# Patient Record
Sex: Male | Born: 1940
Health system: Southern US, Community
[De-identification: ages and names within clinical notes are randomized; demographics above are authoritative.]

## PROBLEM LIST (undated history)

## (undated) DIAGNOSIS — M1990 Unspecified osteoarthritis, unspecified site: Secondary | ICD-10-CM

## (undated) DIAGNOSIS — I429 Cardiomyopathy, unspecified: Secondary | ICD-10-CM

## (undated) DIAGNOSIS — I499 Cardiac arrhythmia, unspecified: Secondary | ICD-10-CM

## (undated) DIAGNOSIS — I119 Hypertensive heart disease without heart failure: Secondary | ICD-10-CM

## (undated) DIAGNOSIS — I10 Essential (primary) hypertension: Secondary | ICD-10-CM

## (undated) DIAGNOSIS — M2550 Pain in unspecified joint: Secondary | ICD-10-CM

## (undated) DIAGNOSIS — R002 Palpitations: Secondary | ICD-10-CM

## (undated) DIAGNOSIS — R9430 Abnormal result of cardiovascular function study, unspecified: Secondary | ICD-10-CM

## (undated) HISTORY — PX: APPENDECTOMY: SHX54

## (undated) HISTORY — DX: Pain in unspecified joint: M25.50

## (undated) HISTORY — DX: Abnormal result of cardiovascular function study, unspecified: R94.30

## (undated) HISTORY — DX: Palpitations: R00.2

## (undated) HISTORY — DX: Hypertensive heart disease without heart failure: I11.9

## (undated) HISTORY — DX: Unspecified osteoarthritis, unspecified site: M19.90

## (undated) HISTORY — DX: Cardiomyopathy, unspecified: I42.9

---

## 2008-05-07 ENCOUNTER — Emergency Department (HOSPITAL_COMMUNITY): Admission: EM | Admit: 2008-05-07 | Discharge: 2008-05-07 | Payer: Self-pay | Admitting: Emergency Medicine

## 2011-05-17 ENCOUNTER — Emergency Department (HOSPITAL_COMMUNITY): Payer: BC Managed Care – PPO

## 2011-05-17 ENCOUNTER — Emergency Department (HOSPITAL_COMMUNITY)
Admission: EM | Admit: 2011-05-17 | Discharge: 2011-05-17 | Disposition: A | Discharge status: Home / Self Care | Payer: BC Managed Care – PPO | Attending: Emergency Medicine | Admitting: Emergency Medicine

## 2011-05-17 DIAGNOSIS — S139XXA Sprain of joints and ligaments of unspecified parts of neck, initial encounter: Secondary | ICD-10-CM | POA: Insufficient documentation

## 2011-05-17 DIAGNOSIS — T1490XA Injury, unspecified, initial encounter: Secondary | ICD-10-CM | POA: Insufficient documentation

## 2011-05-17 DIAGNOSIS — R51 Headache: Secondary | ICD-10-CM | POA: Insufficient documentation

## 2011-05-17 DIAGNOSIS — R002 Palpitations: Secondary | ICD-10-CM | POA: Insufficient documentation

## 2011-05-17 DIAGNOSIS — M542 Cervicalgia: Secondary | ICD-10-CM | POA: Insufficient documentation

## 2011-05-17 DIAGNOSIS — Y92410 Unspecified street and highway as the place of occurrence of the external cause: Secondary | ICD-10-CM | POA: Insufficient documentation

## 2012-04-11 DIAGNOSIS — M545 Low back pain: Secondary | ICD-10-CM | POA: Diagnosis not present

## 2012-04-11 DIAGNOSIS — M542 Cervicalgia: Secondary | ICD-10-CM | POA: Diagnosis not present

## 2012-04-11 DIAGNOSIS — M1710 Unilateral primary osteoarthritis, unspecified knee: Secondary | ICD-10-CM | POA: Diagnosis not present

## 2012-04-11 DIAGNOSIS — M25569 Pain in unspecified knee: Secondary | ICD-10-CM | POA: Diagnosis not present

## 2012-04-17 DIAGNOSIS — H4010X0 Unspecified open-angle glaucoma, stage unspecified: Secondary | ICD-10-CM | POA: Diagnosis not present

## 2012-04-17 DIAGNOSIS — H2510 Age-related nuclear cataract, unspecified eye: Secondary | ICD-10-CM | POA: Diagnosis not present

## 2012-04-17 DIAGNOSIS — H409 Unspecified glaucoma: Secondary | ICD-10-CM | POA: Diagnosis not present

## 2012-05-21 DIAGNOSIS — M545 Low back pain, unspecified: Secondary | ICD-10-CM | POA: Diagnosis not present

## 2012-05-23 DIAGNOSIS — M545 Low back pain: Secondary | ICD-10-CM | POA: Diagnosis not present

## 2012-05-30 DIAGNOSIS — M542 Cervicalgia: Secondary | ICD-10-CM | POA: Diagnosis not present

## 2012-05-30 DIAGNOSIS — M545 Low back pain: Secondary | ICD-10-CM | POA: Diagnosis not present

## 2012-05-30 DIAGNOSIS — M2550 Pain in unspecified joint: Secondary | ICD-10-CM | POA: Diagnosis not present

## 2012-05-30 DIAGNOSIS — M1990 Unspecified osteoarthritis, unspecified site: Secondary | ICD-10-CM | POA: Diagnosis not present

## 2012-06-05 DIAGNOSIS — M47817 Spondylosis without myelopathy or radiculopathy, lumbosacral region: Secondary | ICD-10-CM | POA: Diagnosis not present

## 2012-06-20 DIAGNOSIS — I10 Essential (primary) hypertension: Secondary | ICD-10-CM | POA: Insufficient documentation

## 2012-06-20 DIAGNOSIS — Z888 Allergy status to other drugs, medicaments and biological substances status: Secondary | ICD-10-CM | POA: Insufficient documentation

## 2012-06-20 DIAGNOSIS — R112 Nausea with vomiting, unspecified: Secondary | ICD-10-CM | POA: Insufficient documentation

## 2012-06-21 ENCOUNTER — Emergency Department (HOSPITAL_COMMUNITY)
Admission: EM | Admit: 2012-06-21 | Discharge: 2012-06-21 | Disposition: A | Discharge status: Home / Self Care | Payer: BC Managed Care – PPO | Attending: Emergency Medicine | Admitting: Emergency Medicine

## 2012-06-21 ENCOUNTER — Encounter (HOSPITAL_COMMUNITY): Payer: Self-pay | Admitting: Family Medicine

## 2012-06-21 DIAGNOSIS — Z888 Allergy status to other drugs, medicaments and biological substances status: Secondary | ICD-10-CM | POA: Diagnosis not present

## 2012-06-21 DIAGNOSIS — R112 Nausea with vomiting, unspecified: Secondary | ICD-10-CM

## 2012-06-21 DIAGNOSIS — I10 Essential (primary) hypertension: Secondary | ICD-10-CM | POA: Diagnosis not present

## 2012-06-21 HISTORY — DX: Essential (primary) hypertension: I10

## 2012-06-21 HISTORY — DX: Cardiac arrhythmia, unspecified: I49.9

## 2012-06-21 LAB — CBC WITH DIFFERENTIAL/PLATELET
Basophils Relative: 0 % (ref 0–1)
Eosinophils Absolute: 0 10*3/uL (ref 0.0–0.7)
Lymphs Abs: 1.3 10*3/uL (ref 0.7–4.0)
MCH: 28.2 pg (ref 26.0–34.0)
MCHC: 33 g/dL (ref 30.0–36.0)
Neutrophils Relative %: 75 % (ref 43–77)
Platelets: 178 10*3/uL (ref 150–400)
RBC: 4.82 MIL/uL (ref 4.22–5.81)

## 2012-06-21 LAB — COMPREHENSIVE METABOLIC PANEL
ALT: 15 U/L (ref 0–53)
AST: 18 U/L (ref 0–37)
Albumin: 3.6 g/dL (ref 3.5–5.2)
Alkaline Phosphatase: 74 U/L (ref 39–117)
Potassium: 3.8 mEq/L (ref 3.5–5.1)
Sodium: 140 mEq/L (ref 135–145)
Total Protein: 7.8 g/dL (ref 6.0–8.3)

## 2012-06-21 NOTE — ED Notes (Signed)
Daughter states that patient started having dizziness, nausea and vomiting that started around 830pm. Had 4 episodes of vomiting.

## 2012-06-21 NOTE — ED Provider Notes (Signed)
History     CSN: [id number]  Arrival date & time 06/20/12  2311   First MD Initiated Contact with Patient 06/21/12 0251      Chief Complaint  Patient presents with  . Dizziness and vomiting     (Consider location/radiation/quality/duration/timing/severity/associated sxs/prior treatment) HPI This is a 71 year old African male who had the sudden onset of lightheadedness followed by nausea and vomiting about 8:30 PM yesterday. He vomited 4 times. By the time he got to the emergency department his symptoms had resolved. He had 2 bowel movements but these were of normal consistency. He denies abdominal pain. He denies chest pain. He denies dyspnea. There is no known exacerbating or mitigating factor except that his lightheadedness was worse with standing. He denies a sensation of the room spinning. He continues to be asymptomatic in the ED.  Past Medical History  Diagnosis Date  . Hypertension   . Irregular heart beat     Past Surgical History  Procedure Date  . Appendectomy     No family history on file.  History  Substance Use Topics  . Smoking status: Never Smoker   . Smokeless tobacco: Not on file  . Alcohol Use: No      Review of Systems  All other systems reviewed and are negative.  Left cataract, surgery scheduled next week. Bilateral lower extremity edema.  Allergies  Caine-1  Home Medications   Current Outpatient Rx  Name Route Sig Dispense Refill  . BESIFLOXACIN HCL 0.6 % OP SUSP Left Eye Place 1 drop into the left eye 3 (three) times daily. To start 06/22/12    . DIFLUPREDNATE 0.05 % OP EMUL Left Eye Place 1 drop into the left eye 3 (three) times daily. To start 06/22/12    . LISINOPRIL 20 MG PO TABS Oral Take 20 mg by mouth daily.    Marland Kitchen METOPROLOL SUCCINATE ER 50 MG PO TB24 Oral Take 50 mg by mouth daily. Take with or immediately following a meal.    . NEPAFENAC 0.1 % OP SUSP Left Eye Place 3 drops into the left eye 3 (three) times daily. To start 06/22/12       BP 171/81  Pulse 66  Temp 98.2 F (36.8 C) (Oral)  Resp 20  SpO2 100%  Physical Exam General: Well-developed, well-nourished male in no acute distress; appearance consistent with age of record HENT: normocephalic, atraumatic Eyes: pupils equal round and reactive to light; extraocular muscles intact; left cataract; arcus senilis bilaterally Neck: supple Heart: regular rate and rhythm Lungs: clear to auscultation bilaterally Abdomen: soft; nondistended; nontender; bowel sounds present Extremities: No deformity; full range of motion; +1 edema of lower legs Neurologic: Awake, alert; motor function intact in all extremities and symmetric; no facial droop Skin: Warm and dry Psychiatric: Normal mood and affect    ED Course  Procedures (including critical care time)     MDM   Nursing notes and vitals signs, including pulse oximetry, reviewed.  Summary of this visit's results, reviewed by myself:  Labs:  Results for orders placed during the hospital encounter of 06/21/12  CBC WITH DIFFERENTIAL      Component Value Range   WBC 7.5  4.0 - 10.5 K/uL   RBC 4.82  4.22 - 5.81 MIL/uL   Hemoglobin 13.6  13.0 - 17.0 g/dL   HCT 41.2  39.0 - 52.0 %   MCV 85.5  78.0 - 100.0 fL   MCH 28.2  26.0 - 34.0 pg   MCHC 33.0  30.0 - 36.0 g/dL   RDW 14.9  11.5 - 15.5 %   Platelets 178  150 - 400 K/uL   Neutrophils Relative 75  43 - 77 %   Neutro Abs 5.6  1.7 - 7.7 K/uL   Lymphocytes Relative 17  12 - 46 %   Lymphs Abs 1.3  0.7 - 4.0 K/uL   Monocytes Relative 8  3 - 12 %   Monocytes Absolute 0.6  0.1 - 1.0 K/uL   Eosinophils Relative 0  0 - 5 %   Eosinophils Absolute 0.0  0.0 - 0.7 K/uL   Basophils Relative 0  0 - 1 %   Basophils Absolute 0.0  0.0 - 0.1 K/uL  COMPREHENSIVE METABOLIC PANEL      Component Value Range   Sodium 140  135 - 145 mEq/L   Potassium 3.8  3.5 - 5.1 mEq/L   Chloride 102  96 - 112 mEq/L   CO2 31  19 - 32 mEq/L   Glucose, Bld 107 (*) 70 - 99 mg/dL   BUN 13  6  - 23 mg/dL   Creatinine, Ser 1.00  0.50 - 1.35 mg/dL   Calcium 9.5  8.4 - 10.5 mg/dL   Total Protein 7.8  6.0 - 8.3 g/dL   Albumin 3.6  3.5 - 5.2 g/dL   AST 18  0 - 37 U/L   ALT 15  0 - 53 U/L   Alkaline Phosphatase 74  39 - 117 U/L   Total Bilirubin 0.7  0.3 - 1.2 mg/dL   GFR calc non Af Amer 74 (*) >90 mL/min   GFR calc Af Amer 86 (*) >90 mL/min  LIPASE, BLOOD      Component Value Range   Lipase 23  11 - 59 U/L            Wynetta Fines, MD 06/21/12 [id number]

## 2012-06-25 DIAGNOSIS — H269 Unspecified cataract: Secondary | ICD-10-CM | POA: Diagnosis not present

## 2012-06-25 DIAGNOSIS — H2510 Age-related nuclear cataract, unspecified eye: Secondary | ICD-10-CM | POA: Diagnosis not present

## 2012-06-26 DIAGNOSIS — H2510 Age-related nuclear cataract, unspecified eye: Secondary | ICD-10-CM | POA: Diagnosis not present

## 2012-07-02 DIAGNOSIS — M545 Low back pain: Secondary | ICD-10-CM | POA: Diagnosis not present

## 2012-07-24 DIAGNOSIS — H5310 Unspecified subjective visual disturbances: Secondary | ICD-10-CM | POA: Diagnosis not present

## 2012-08-30 DIAGNOSIS — I428 Other cardiomyopathies: Secondary | ICD-10-CM | POA: Diagnosis not present

## 2012-08-30 DIAGNOSIS — R002 Palpitations: Secondary | ICD-10-CM | POA: Diagnosis not present

## 2012-09-11 DIAGNOSIS — Z79899 Other long term (current) drug therapy: Secondary | ICD-10-CM | POA: Diagnosis not present

## 2012-09-11 DIAGNOSIS — E7800 Pure hypercholesterolemia, unspecified: Secondary | ICD-10-CM | POA: Diagnosis not present

## 2013-06-18 DIAGNOSIS — H4010X0 Unspecified open-angle glaucoma, stage unspecified: Secondary | ICD-10-CM | POA: Diagnosis not present

## 2013-06-18 DIAGNOSIS — H409 Unspecified glaucoma: Secondary | ICD-10-CM | POA: Diagnosis not present

## 2013-06-25 DIAGNOSIS — H409 Unspecified glaucoma: Secondary | ICD-10-CM | POA: Diagnosis not present

## 2013-06-25 DIAGNOSIS — H4010X0 Unspecified open-angle glaucoma, stage unspecified: Secondary | ICD-10-CM | POA: Diagnosis not present

## 2013-07-30 ENCOUNTER — Telehealth: Payer: Self-pay | Admitting: Cardiology

## 2013-07-30 NOTE — Telephone Encounter (Signed)
New Problem   Pt's daughter called stating that he has discomfort in his chest//uneasy to swollow// requesting same day appt// please assist.

## 2013-08-04 ENCOUNTER — Encounter: Payer: Self-pay | Admitting: Cardiology

## 2013-08-04 ENCOUNTER — Encounter: Payer: Self-pay | Admitting: *Deleted

## 2013-08-05 ENCOUNTER — Ambulatory Visit: Payer: BC Managed Care – PPO | Admitting: Cardiology

## 2013-08-08 NOTE — Telephone Encounter (Signed)
This number is listed as the wrong number,,,,,

## 2013-08-12 DIAGNOSIS — I83893 Varicose veins of bilateral lower extremities with other complications: Secondary | ICD-10-CM | POA: Diagnosis not present

## 2013-08-12 DIAGNOSIS — T148XXA Other injury of unspecified body region, initial encounter: Secondary | ICD-10-CM | POA: Diagnosis not present

## 2013-08-12 DIAGNOSIS — Z23 Encounter for immunization: Secondary | ICD-10-CM | POA: Diagnosis not present

## 2013-11-11 DIAGNOSIS — H409 Unspecified glaucoma: Secondary | ICD-10-CM | POA: Diagnosis not present

## 2013-11-11 DIAGNOSIS — H4011X0 Primary open-angle glaucoma, stage unspecified: Secondary | ICD-10-CM | POA: Diagnosis not present

## 2014-01-08 DIAGNOSIS — H409 Unspecified glaucoma: Secondary | ICD-10-CM | POA: Diagnosis not present

## 2014-01-08 DIAGNOSIS — H4011X0 Primary open-angle glaucoma, stage unspecified: Secondary | ICD-10-CM | POA: Diagnosis not present

## 2014-01-27 DIAGNOSIS — H4011X0 Primary open-angle glaucoma, stage unspecified: Secondary | ICD-10-CM | POA: Diagnosis not present

## 2014-01-27 DIAGNOSIS — H409 Unspecified glaucoma: Secondary | ICD-10-CM | POA: Diagnosis not present

## 2014-07-02 DIAGNOSIS — H4011X0 Primary open-angle glaucoma, stage unspecified: Secondary | ICD-10-CM | POA: Diagnosis not present

## 2014-07-02 DIAGNOSIS — H409 Unspecified glaucoma: Secondary | ICD-10-CM | POA: Diagnosis not present

## 2014-12-09 ENCOUNTER — Encounter (HOSPITAL_COMMUNITY): Payer: Self-pay | Admitting: Emergency Medicine

## 2014-12-09 ENCOUNTER — Emergency Department (HOSPITAL_COMMUNITY)
Admission: EM | Admit: 2014-12-09 | Discharge: 2014-12-09 | Disposition: A | Discharge status: Home / Self Care | Payer: BLUE CROSS/BLUE SHIELD | Attending: Emergency Medicine | Admitting: Emergency Medicine

## 2014-12-09 DIAGNOSIS — Z792 Long term (current) use of antibiotics: Secondary | ICD-10-CM | POA: Insufficient documentation

## 2014-12-09 DIAGNOSIS — Z791 Long term (current) use of non-steroidal anti-inflammatories (NSAID): Secondary | ICD-10-CM | POA: Insufficient documentation

## 2014-12-09 DIAGNOSIS — R112 Nausea with vomiting, unspecified: Secondary | ICD-10-CM | POA: Diagnosis not present

## 2014-12-09 DIAGNOSIS — Z79899 Other long term (current) drug therapy: Secondary | ICD-10-CM | POA: Diagnosis not present

## 2014-12-09 DIAGNOSIS — I119 Hypertensive heart disease without heart failure: Secondary | ICD-10-CM | POA: Insufficient documentation

## 2014-12-09 DIAGNOSIS — I10 Essential (primary) hypertension: Secondary | ICD-10-CM | POA: Diagnosis not present

## 2014-12-09 DIAGNOSIS — Z8739 Personal history of other diseases of the musculoskeletal system and connective tissue: Secondary | ICD-10-CM | POA: Diagnosis not present

## 2014-12-09 LAB — COMPREHENSIVE METABOLIC PANEL
ALT: 27 U/L (ref 0–53)
AST: 36 U/L (ref 0–37)
Albumin: 3.9 g/dL (ref 3.5–5.2)
Alkaline Phosphatase: 83 U/L (ref 39–117)
Anion gap: 4 — ABNORMAL LOW (ref 5–15)
BILIRUBIN TOTAL: 0.6 mg/dL (ref 0.3–1.2)
BUN: 20 mg/dL (ref 6–23)
CHLORIDE: 108 mmol/L (ref 96–112)
CO2: 24 mmol/L (ref 19–32)
Calcium: 8.6 mg/dL (ref 8.4–10.5)
Creatinine, Ser: 1.21 mg/dL (ref 0.50–1.35)
GFR calc Af Amer: 67 mL/min — ABNORMAL LOW (ref >90)
GFR, EST NON AFRICAN AMERICAN: 58 mL/min — AB (ref >90)
GLUCOSE: 171 mg/dL — AB (ref 70–99)
POTASSIUM: 4 mmol/L (ref 3.5–5.1)
SODIUM: 136 mmol/L (ref 135–145)
Total Protein: 7.8 g/dL (ref 6.0–8.3)

## 2014-12-09 LAB — I-STAT CHEM 8, ED
BUN: 19 mg/dL (ref 6–23)
CREATININE: 1.2 mg/dL (ref 0.50–1.35)
Calcium, Ion: 1.23 mmol/L (ref 1.13–1.30)
Chloride: 106 mmol/L (ref 96–112)
GLUCOSE: 168 mg/dL — AB (ref 70–99)
HCT: 45 % (ref 39.0–52.0)
Hemoglobin: 15.3 g/dL (ref 13.0–17.0)
POTASSIUM: 3.8 mmol/L (ref 3.5–5.1)
SODIUM: 142 mmol/L (ref 135–145)
TCO2: 20 mmol/L (ref 0–100)

## 2014-12-09 LAB — URINALYSIS, ROUTINE W REFLEX MICROSCOPIC
BILIRUBIN URINE: NEGATIVE
GLUCOSE, UA: NEGATIVE mg/dL
HGB URINE DIPSTICK: NEGATIVE
Ketones, ur: NEGATIVE mg/dL
LEUKOCYTES UA: NEGATIVE
Nitrite: NEGATIVE
PH: 7 (ref 5.0–8.0)
PROTEIN: NEGATIVE mg/dL
SPECIFIC GRAVITY, URINE: 1.01 (ref 1.005–1.030)
UROBILINOGEN UA: 0.2 mg/dL (ref 0.0–1.0)

## 2014-12-09 LAB — CBC WITH DIFFERENTIAL/PLATELET
Basophils Absolute: 0 10*3/uL (ref 0.0–0.1)
Basophils Relative: 0 % (ref 0–1)
EOS ABS: 0 10*3/uL (ref 0.0–0.7)
Eosinophils Relative: 0 % (ref 0–5)
HCT: 41.1 % (ref 39.0–52.0)
HEMOGLOBIN: 13 g/dL (ref 13.0–17.0)
LYMPHS PCT: 36 % (ref 12–46)
Lymphs Abs: 3 10*3/uL (ref 0.7–4.0)
MCH: 27.9 pg (ref 26.0–34.0)
MCHC: 31.6 g/dL (ref 30.0–36.0)
MCV: 88.2 fL (ref 78.0–100.0)
Monocytes Absolute: 0.4 10*3/uL (ref 0.1–1.0)
Monocytes Relative: 5 % (ref 3–12)
NEUTROS ABS: 5 10*3/uL (ref 1.7–7.7)
NEUTROS PCT: 59 % (ref 43–77)
Platelets: 151 10*3/uL (ref 150–400)
RBC: 4.66 MIL/uL (ref 4.22–5.81)
RDW: 14.7 % (ref 11.5–15.5)
WBC: 8.5 10*3/uL (ref 4.0–10.5)

## 2014-12-09 LAB — I-STAT CG4 LACTIC ACID, ED: Lactic Acid, Venous: 1.85 mmol/L (ref 0.5–2.0)

## 2014-12-09 LAB — I-STAT TROPONIN, ED: Troponin i, poc: 0 ng/mL (ref 0.00–0.08)

## 2014-12-09 LAB — LIPASE, BLOOD: LIPASE: 26 U/L (ref 11–59)

## 2014-12-09 MED ORDER — ONDANSETRON 4 MG PO TBDP: ORAL_TABLET | ORAL | Status: DC

## 2014-12-09 MED ORDER — SODIUM CHLORIDE 0.9 % IV BOLUS (SEPSIS)
1000.0000 mL | Freq: Once | INTRAVENOUS | Status: AC
  Administered 2014-12-09: 1000 mL via INTRAVENOUS

## 2014-12-09 MED ORDER — ONDANSETRON HCL 4 MG/2ML IJ SOLN
4.0000 mg | Freq: Once | INTRAMUSCULAR | Status: AC
  Administered 2014-12-09: 4 mg via INTRAVENOUS
  Filled 2014-12-09: qty 2

## 2014-12-09 NOTE — ED Notes (Signed)
Pt ambulated to restroom and back to bed with assistance. Pt reports having a bowel movement but not able to obtain a urine sample. Provided patient ginger ale.

## 2014-12-09 NOTE — ED Provider Notes (Signed)
CSN: [id number]     Arrival date & time 12/09/14  0454 History   First MD Initiated Contact with Patient 12/09/14 0617     Chief Complaint  Patient presents with  . Emesis     (Consider location/radiation/quality/duration/timing/severity/associated sxs/prior Treatment) HPI Shane Vargas is a 74 y.o. male with a history of hypertension comes in for evaluation of nausea and vomiting. Patient states this morning at approximately 4 AM he became nauseous and vomited, nonbloody nonbilious. He reports he had a banana for breakfast. He denies any other associated symptoms, no chest pain, shortness of breath, cough, abdominal pain, diarrhea or constipation, dizziness, syncope. He reports his symptoms have resolved at this point. He did not try anything to relieve his symptoms. There are no other modifying factors.   Past Medical History  Diagnosis Date  . Hypertension   . Irregular heart beat     Echocardiogram -[id number], diastolic dysfunction, trivial valvular lesions  . Cardiomyopathy     Nuclear stress test- 02/16/09 - 7 minutes 6 seconds, dilated LV, subtle decreased uptake apical region, no ischemia, EF 42%   . Palpitation     Holter-01/16/09-frequent PVCs/couplets, one triplet (NSVT). 8 PACs. Average heart rate 82 beats per minute  . Osteoarthritis   . Pain in joint, multiple sites   . Nonspecific abnormal unspecified cardiovascular function study   . Benign hypertensive heart disease without heart failure    Past Surgical History  Procedure Laterality Date  . Appendectomy     History reviewed. No pertinent family history. History  Substance Use Topics  . Smoking status: Never Smoker   . Smokeless tobacco: Not on file  . Alcohol Use: No    Review of Systems A 10 point review of systems was completed and was negative except for pertinent positives and negatives as mentioned in the history of present illness     Allergies  Caine-1  Home Medications   Prior to Admission  medications   Medication Sig Start Date End Date Taking? Authorizing Provider  lisinopril (PRINIVIL,ZESTRIL) 20 MG tablet Take 20 mg by mouth daily.   Yes Historical Provider, MD  metoprolol succinate (TOPROL-XL) 50 MG 24 hr tablet Take 50 mg by mouth daily. Take with or immediately following a meal.   Yes Historical Provider, MD  Besifloxacin HCl (BESIVANCE) 0.6 % SUSP Place 1 drop into the left eye 3 (three) times daily. To start 06/22/12    Historical Provider, MD  Difluprednate (DUREZOL) 0.05 % EMUL Place 1 drop into the left eye 3 (three) times daily. To start 06/22/12    Historical Provider, MD  nepafenac (NEVANAC) 0.1 % ophthalmic suspension Place 3 drops into the left eye 3 (three) times daily. To start 06/22/12    Historical Provider, MD  ondansetron (ZOFRAN ODT) 4 MG disintegrating tablet 4mg  ODT q4 hours prn nausea/vomit 12/09/14   Viona Gilmore Jasyn Mey, PA-C   BP 185/88 mmHg  Pulse 71  Temp(Src) 97.5 F (36.4 C) (Oral)  Resp 20  SpO2 99% Physical Exam  Constitutional: He is oriented to person, place, and time. He appears well-developed and well-nourished.  HENT:  Head: Normocephalic and atraumatic.  Mouth/Throat: Oropharynx is clear and moist.  Eyes: Conjunctivae are normal. Pupils are equal, round, and reactive to light. Right eye exhibits no discharge. Left eye exhibits no discharge. No scleral icterus.  Neck: Normal range of motion. Neck supple.  Cardiovascular: Normal rate, regular rhythm and normal heart sounds.   Pulmonary/Chest: Effort normal and breath sounds normal. No  respiratory distress. He has no wheezes. He has no rales.  Abdominal: Soft. Bowel sounds are normal. He exhibits no distension and no mass. There is no tenderness. There is no rebound and no guarding.  Musculoskeletal: He exhibits no tenderness.  Neurological: He is alert and oriented to person, place, and time.  Cranial Nerves II-XII grossly intact  Skin: Skin is warm and dry. No rash noted.  Psychiatric: He  has a normal mood and affect.  Nursing note and vitals reviewed.   ED Course  Procedures (including critical care time) Labs Review Labs Reviewed  COMPREHENSIVE METABOLIC PANEL - Abnormal; Notable for the following:    Glucose, Bld 171 (*)    GFR calc non Af Amer 58 (*)    GFR calc Af Amer 67 (*)    Anion gap 4 (*)    All other components within normal limits  I-STAT CHEM 8, ED - Abnormal; Notable for the following:    Glucose, Bld 168 (*)    All other components within normal limits  CBC WITH DIFFERENTIAL/PLATELET  LIPASE, BLOOD  URINALYSIS, ROUTINE W REFLEX MICROSCOPIC  I-STAT CG4 LACTIC ACID, ED  I-STAT TROPOININ, ED    Imaging Review No results found.   EKG Interpretation   Date/Time:  Tuesday December 09 2014 05:07:49 EST Ventricular Rate:  70 PR Interval:  226 QRS Duration: 102 QT Interval:  433 QTC Calculation: 467 R Axis:   27 Text Interpretation:  Sinus rhythm Ventricular premature complex Prolonged  PR interval Confirmed by Glynn Octave [phone]) on 12/09/2014 6:29:46  AM     Meds given in ED:  Medications  ondansetron (ZOFRAN) injection 4 mg (4 mg Intravenous Given 12/09/14 0532)  sodium chloride 0.9 % bolus 1,000 mL (0 mLs Intravenous Stopped 12/09/14 0910)    Discharge Medication List as of 12/09/2014  8:38 AM    START taking these medications   Details  ondansetron (ZOFRAN ODT) 4 MG disintegrating tablet 4mg  ODT q4 hours prn nausea/vomit, Print       Filed Vitals:   12/09/14 0456 12/09/14 0647 12/09/14 0908  BP: 190/82 185/79 185/88  Pulse: 60 55 71  Temp: 97.5 F (36.4 C) 97.3 F (36.3 C) 97.5 F (36.4 C)  TempSrc: Oral Oral Oral  Resp: 20 19 20   SpO2: 100% 100% 99%    MDM  Vitals stable  -afebrile,  Pt resting comfortably in ED. Tolerating PO IN ED  PE--benign abdominal exam, Ambulates to and from the restroom without difficulty.  Labwork Noncontributory . EKG is reassuring, prolonged PR interval is chronic. Troponin  negative  Patient feels well at this time, denies any symptoms. No nausea or vomiting, tolerating fluids in ED. Ambulate independently, labs are reassuring. I feel patient is stable for discharge with Zofran and instructions for clear fluid rehydration. Discussed we'll need to follow-up with his primary care for blood pressure management.  I discussed all relevant lab findings and imaging results with pt and they verbalized understanding. Discussed f/u with PCP within 48 hrs and return precautions, pt very amenable to plan. Prior to patient discharge, I discussed and reviewed this case with Dr. Tawnya Crook, who also saw and evaluated the patient     Final diagnoses:  Non-intractable vomiting with nausea, vomiting of unspecified type        Verl Dicker, PA-C 12/09/14 1734  Ernestina Patches, MD 12/10/14 [phone]

## 2014-12-09 NOTE — ED Notes (Signed)
Pt resting quietly with eyes closed. Appears in acute distress.

## 2014-12-09 NOTE — ED Notes (Signed)
Family states pt was at work and they called her and told her he was throwing up  Pt is c/o general weakness and feeling tired  Denies pain

## 2014-12-09 NOTE — Discharge Instructions (Signed)

## 2016-08-31 DIAGNOSIS — H401133 Primary open-angle glaucoma, bilateral, severe stage: Secondary | ICD-10-CM | POA: Diagnosis not present

## 2016-12-09 DIAGNOSIS — H401133 Primary open-angle glaucoma, bilateral, severe stage: Secondary | ICD-10-CM | POA: Diagnosis not present

## 2017-01-04 DIAGNOSIS — H401133 Primary open-angle glaucoma, bilateral, severe stage: Secondary | ICD-10-CM | POA: Diagnosis not present

## 2017-02-02 DIAGNOSIS — Z86010 Personal history of colonic polyps: Secondary | ICD-10-CM | POA: Diagnosis not present

## 2017-02-02 DIAGNOSIS — D126 Benign neoplasm of colon, unspecified: Secondary | ICD-10-CM | POA: Diagnosis not present

## 2017-02-16 DIAGNOSIS — E7800 Pure hypercholesterolemia, unspecified: Secondary | ICD-10-CM | POA: Diagnosis not present

## 2017-02-16 DIAGNOSIS — I429 Cardiomyopathy, unspecified: Secondary | ICD-10-CM | POA: Diagnosis not present

## 2017-02-16 DIAGNOSIS — I10 Essential (primary) hypertension: Secondary | ICD-10-CM | POA: Diagnosis not present

## 2017-02-16 DIAGNOSIS — Z1389 Encounter for screening for other disorder: Secondary | ICD-10-CM | POA: Diagnosis not present

## 2017-02-16 DIAGNOSIS — Z125 Encounter for screening for malignant neoplasm of prostate: Secondary | ICD-10-CM | POA: Diagnosis not present

## 2017-03-15 DIAGNOSIS — E860 Dehydration: Secondary | ICD-10-CM | POA: Diagnosis not present

## 2017-03-15 DIAGNOSIS — D649 Anemia, unspecified: Secondary | ICD-10-CM | POA: Diagnosis not present

## 2017-03-16 ENCOUNTER — Encounter: Payer: Self-pay | Admitting: Cardiology

## 2017-03-23 ENCOUNTER — Encounter: Payer: Self-pay | Admitting: Cardiology

## 2017-04-07 ENCOUNTER — Ambulatory Visit: Payer: Medicare Other | Admitting: Cardiology

## 2017-04-24 ENCOUNTER — Encounter: Payer: Self-pay | Admitting: Physician Assistant

## 2017-04-24 ENCOUNTER — Ambulatory Visit (INDEPENDENT_AMBULATORY_CARE_PROVIDER_SITE_OTHER): Payer: BLUE CROSS/BLUE SHIELD | Admitting: Physician Assistant

## 2017-04-24 ENCOUNTER — Encounter (INDEPENDENT_AMBULATORY_CARE_PROVIDER_SITE_OTHER): Payer: Self-pay

## 2017-04-24 VITALS — BP 118/70 | HR 57 | Ht 68.0 in | Wt 225.0 lb

## 2017-04-24 DIAGNOSIS — R002 Palpitations: Secondary | ICD-10-CM | POA: Insufficient documentation

## 2017-04-24 DIAGNOSIS — E785 Hyperlipidemia, unspecified: Secondary | ICD-10-CM

## 2017-04-24 DIAGNOSIS — R079 Chest pain, unspecified: Secondary | ICD-10-CM

## 2017-04-24 DIAGNOSIS — N183 Chronic kidney disease, stage 3 unspecified: Secondary | ICD-10-CM | POA: Insufficient documentation

## 2017-04-24 DIAGNOSIS — I5189 Other ill-defined heart diseases: Secondary | ICD-10-CM

## 2017-04-24 DIAGNOSIS — I519 Heart disease, unspecified: Secondary | ICD-10-CM

## 2017-04-24 DIAGNOSIS — I10 Essential (primary) hypertension: Secondary | ICD-10-CM | POA: Diagnosis not present

## 2017-04-24 NOTE — Patient Instructions (Addendum)
Medication Instructions:  Your physician recommends that you continue on your current medications as directed. Please refer to the Current Medication list given to you today.   Labwork: NONE ORDERED TODAY   Testing/Procedures: 1. Your physician has requested that you have en exercise stress myoview. For further information please visit [email or web address]. Please follow instruction sheet, as given.  2. Your physician has recommended that you wear a 48 HOUR holter monitor. Holter monitors are medical devices that record the heart's electrical activity. Doctors most often use these monitors to diagnose arrhythmias. Arrhythmias are problems with the speed or rhythm of the heartbeat. The monitor is a small, portable device. You can wear one while you do your normal daily activities. This is usually used to diagnose what is causing palpitations/syncope (passing out).    Follow-Up: DR. Marlou Porch 1 WEEK AFTER STRESS TEST HAS BEEN COMPLETED  Any Other Special Instructions Will Be Listed Below (If Applicable). If you need a refill on your cardiac medications before your next appointment, please call your pharmacy.   Low-Sodium Eating Plan Sodium, which is an element that makes up salt, helps you maintain a healthy balance of fluids in your body. Too much sodium can increase your blood pressure and cause fluid and waste to be held in your body. Your health care provider or dietitian may recommend following this plan if you have high blood pressure (hypertension), kidney disease, liver disease, or heart failure. Eating less sodium can help lower your blood pressure, reduce swelling, and protect your heart, liver, and kidneys. What are tips for following this plan? General guidelines  Most people on this plan should limit their sodium intake to 1,500-2,000 mg (milligrams) of sodium each day. Reading food labels  The Nutrition Facts label lists the amount of sodium in one serving of the food. If you  eat more than one serving, you must multiply the listed amount of sodium by the number of servings.  Choose foods with less than 140 mg of sodium per serving.  Avoid foods with 300 mg of sodium or more per serving. Shopping  Look for lower-sodium products, often labeled as "low-sodium" or "no salt added."  Always check the sodium content even if foods are labeled as "unsalted" or "no salt added".  Buy fresh foods. ? Avoid canned foods and premade or frozen meals. ? Avoid canned, cured, or processed meats  Buy breads that have less than 80 mg of sodium per slice. Cooking  Eat more home-cooked food and less restaurant, buffet, and fast food.  Avoid adding salt when cooking. Use salt-free seasonings or herbs instead of table salt or sea salt. Check with your health care provider or pharmacist before using salt substitutes.  Cook with plant-based oils, such as canola, sunflower, or olive oil. Meal planning  When eating at a restaurant, ask that your food be prepared with less salt or no salt, if possible.  Avoid foods that contain MSG (monosodium glutamate). MSG is sometimes added to [place] food, bouillon, and some canned foods. What foods are recommended? The items listed may not be a complete list. Talk with your dietitian about what dietary choices are best for you. Grains Low-sodium cereals, including oats, puffed wheat and rice, and shredded wheat. Low-sodium crackers. Unsalted rice. Unsalted pasta. Low-sodium bread. Whole-grain breads and whole-grain pasta. Vegetables Fresh or frozen vegetables. "No salt added" canned vegetables. "No salt added" tomato sauce and paste. Low-sodium or reduced-sodium tomato and vegetable juice. Fruits Fresh, frozen, or canned fruit. Fruit juice. Meats and  other protein foods Fresh or frozen (no salt added) meat, poultry, seafood, and fish. Low-sodium canned tuna and salmon. Unsalted nuts. Dried peas, beans, and lentils without added salt.  Unsalted canned beans. Eggs. Unsalted nut butters. Dairy Milk. Soy milk. Cheese that is naturally low in sodium, such as ricotta cheese, fresh mozzarella, or Swiss cheese Low-sodium or reduced-sodium cheese. Cream cheese. Yogurt. Fats and oils Unsalted butter. Unsalted margarine with no trans fat. Vegetable oils such as canola or olive oils. Seasonings and other foods Fresh and dried herbs and spices. Salt-free seasonings. Low-sodium mustard and ketchup. Sodium-free salad dressing. Sodium-free light mayonnaise. Fresh or refrigerated horseradish. Lemon juice. Vinegar. Homemade, reduced-sodium, or low-sodium soups. Unsalted popcorn and pretzels. Low-salt or salt-free chips. What foods are not recommended? The items listed may not be a complete list. Talk with your dietitian about what dietary choices are best for you. Grains Instant hot cereals. Bread stuffing, pancake, and biscuit mixes. Croutons. Seasoned rice or pasta mixes. Noodle soup cups. Boxed or frozen macaroni and cheese. Regular salted crackers. Self-rising flour. Vegetables Sauerkraut, pickled vegetables, and relishes. Olives. [place] fries. Onion rings. Regular canned vegetables (not low-sodium or reduced-sodium). Regular canned tomato sauce and paste (not low-sodium or reduced-sodium). Regular tomato and vegetable juice (not low-sodium or reduced-sodium). Frozen vegetables in sauces. Meats and other protein foods Meat or fish that is salted, canned, smoked, spiced, or pickled. Bacon, ham, sausage, hotdogs, corned beef, chipped beef, packaged lunch meats, salt pork, jerky, pickled herring, anchovies, regular canned tuna, sardines, salted nuts. Dairy Processed cheese and cheese spreads. Cheese curds. Blue cheese. Feta cheese. String cheese. Regular cottage cheese. Buttermilk. Canned milk. Fats and oils Salted butter. Regular margarine. Ghee. Bacon fat. Seasonings and other foods Onion salt, garlic salt, seasoned salt, table salt, and sea  salt. Canned and packaged gravies. Worcestershire sauce. Tartar sauce. Barbecue sauce. Teriyaki sauce. Soy sauce, including reduced-sodium. Steak sauce. Fish sauce. Oyster sauce. Cocktail sauce. Horseradish that you find on the shelf. Regular ketchup and mustard. Meat flavorings and tenderizers. Bouillon cubes. Hot sauce and Tabasco sauce. Premade or packaged marinades. Premade or packaged taco seasonings. Relishes. Regular salad dressings. Salsa. Potato and tortilla chips. Corn chips and puffs. Salted popcorn and pretzels. Canned or dried soups. Pizza. Frozen entrees and pot pies. Summary  Eating less sodium can help lower your blood pressure, reduce swelling, and protect your heart, liver, and kidneys.  Most people on this plan should limit their sodium intake to 1,500-2,000 mg (milligrams) of sodium each day.  Canned, boxed, and frozen foods are high in sodium. Restaurant foods, fast foods, and pizza are also very high in sodium. You also get sodium by adding salt to food.  Try to cook at home, eat more fresh fruits and vegetables, and eat less fast food, canned, processed, or prepared foods. This information is not intended to replace advice given to you by your health care provider. Make sure you discuss any questions you have with your health care provider. Document Released: 03/18/2002 Document Revised: 09/19/2016 Document Reviewed: 09/19/2016 Elsevier Interactive Patient Education  2017 [employer].

## 2017-04-24 NOTE — Progress Notes (Signed)
Cardiology Office Note    Date:  04/24/2017   ID:  Shane Vargas, DOB 04-25-1941, MRN [id number]  PCP:  System, Pcp Not In  Cardiologist: New, seen by Dr. Marlou Porch 2014  Chief Complaint  Patient presents with  . Chest Pain    History of Present Illness:  Shane Vargas is a 76 y.o. male with history of nuclear stress test 02/16/09 showing no ischemia LVEF 42%. Echo 01/19/09 EF 98% with diastolic dysfunction, Holter 01/16/09 frequent PVCs, referred back today for follow-up of cardiomyopathy. Patient also has essential hypertension, hypercholesterolemia. Labs reviewed from primary care 02/2017 creatinine 1.61, lipid panel excellent.  Patient comes in today accompanied by his daughter who is interpreting for him. He speaks [place]. He complains of palpitations and a little burning in his chest that happens when he eats peppers that goes away with drinking water. His heart races for a few minutes and goes away with water. No skipping or dizziness associated with it. He walks the neighborhood and denies any chest pressure, tightness, or heaviness or shortness of breath. 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 He says his blood pressure was very high and he was started on new medication. Since then he has had no further chest pressure. His father died at 20 of an MI. He does not smoke. He eats a lot of salt. Blood pressure is much better since he's been started on valsartan/HCTZ.  Past Medical History:  Diagnosis Date  . Benign hypertensive heart disease without heart failure   . Cardiomyopathy [facility])    Nuclear stress test- 02/16/09 - 7 minutes 6 seconds, dilated LV, subtle decreased uptake apical region, no ischemia, EF 42%   . Hypertension   . Irregular heart beat    Echocardiogram -3/38/25-KN 39%, diastolic dysfunction, trivial valvular lesions  . Nonspecific abnormal unspecified cardiovascular function study   . Osteoarthritis   . Pain in joint, multiple sites   .  Palpitation    Holter-01/16/09-frequent PVCs/couplets, one triplet (NSVT). 8 PACs. Average heart rate 82 beats per minute    Past Surgical History:  Procedure Laterality Date  . APPENDECTOMY      Current Medications: Current Meds  Medication Sig  . atorvastatin (LIPITOR) 40 MG tablet Take 40 mg by mouth daily.  . metoprolol succinate (TOPROL-XL) 50 MG 24 hr tablet Take 50 mg by mouth daily. Take with or immediately following a meal.  . valsartan-hydrochlorothiazide (DIOVAN-HCT) 320-25 MG tablet Take 1 tablet by mouth daily.  . [DISCONTINUED] Amlodipine-Valsartan-HCTZ 76-734-19 MG TABS Take by mouth.  . [DISCONTINUED] lisinopril (PRINIVIL,ZESTRIL) 20 MG tablet Take 20 mg by mouth daily.     Allergies:   Caine-1 [lidocaine]   Social History   Social History  . Marital status: Single    Spouse name: N/A  . Number of children: N/A  . Years of education: N/A   Social History Main Topics  . Smoking status: Never Smoker  . Smokeless tobacco: Never Used  . Alcohol use No  . Drug use: No  . Sexual activity: Not Currently   Other Topics Concern  . None   Social History Narrative  . None     Family History:  The patient's family history includes Heart disease (age of onset: 43) in his father.   ROS:   Please see the history of present illness.    Review of Systems  Constitution: Positive for malaise/fatigue.  HENT: Negative.   Cardiovascular: Positive for chest pain.  Respiratory: Negative.  Endocrine: Negative.   Hematologic/Lymphatic: Negative.   Musculoskeletal: Negative.   Gastrointestinal: Negative.   Genitourinary: Negative.   Neurological: Negative.    All other systems reviewed and are negative.   PHYSICAL EXAM:   VS:  BP 118/70   Pulse (!) 57   Ht 5\' 8"  (1.727 m)   Wt 225 lb (102.1 kg)   SpO2 95%   BMI 34.21 kg/m   Physical Exam  GEN: Obese, in no acute distress  Neck: no JVD, carotid bruits, or masses Cardiac:RRR; no murmurs, rubs, or gallops    Respiratory:  clear to auscultation bilaterally, normal work of breathing GI: soft, nontender, nondistended, + BS Ext: Trace of edema otherwise without cyanosis, clubbing, Good distal pulses bilaterally Neuro:  Alert and Oriented x 3 Psych: euthymic mood, full affect  Wt Readings from Last 3 Encounters:  04/24/17 225 lb (102.1 kg)      Studies/Labs Reviewed:   EKG:  EKG is ordered today.  The ekg ordered today demonstrates  Sinus bradycardia 57 bpm otherwise normal  Recent Labs: No results found for requested labs within last 8760 hours.   Lipid Panel No results found for: CHOL, TRIG, HDL, CHOLHDL, VLDL, LDLCALC, LDLDIRECT  Additional studies/ records that were reviewed today include:  See above     ASSESSMENT:    1. Chest pain, unspecified type   2. Palpitations   3. Essential hypertension   4. Hyperlipidemia, unspecified hyperlipidemia type   5. Diastolic dysfunction   6. 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 No recurrence since blood pressure medications adjusted. Walks without difficulty. Does have risk factors for CAD including family history, hypertension and hyperlipidemia. Will order exercise Myoview. Follow-up with Dr. Marlou Porch in 1 month  Palpitations occur after eating peppers and relieved with drinking water. Described as a fast heart rate.We'll order 48 hour monitor to rule out arrhythmia.  Essential hypertension controlled with valsartan/HCTZ and metoprolol  Hyperlipidemia controlled with atorvastatin lipid panel from 02/2017 reviewed and was excellent  Diastolic dysfunction on echo in 2010. Patient has mild edema. He does eat a lot of salt. 2 g sodium diet. May need to repeat echo in the future. Await stress test.  CK D stage III followed by primary  care     Medication Adjustments/Labs and Tests Ordered: Current medicines are reviewed at length with the patient today.  Concerns regarding medicines are outlined above.  Medication changes, Labs and Tests ordered today are listed in the Patient Instructions below. Patient Instructions  Medication Instructions:  Your physician recommends that you continue on your current medications as directed. Please refer to the Current Medication list given to you today.   Labwork: NONE ORDERED TODAY   Testing/Procedures: 1. Your physician has requested that you have en exercise stress myoview. For further information please visit [email or web address]. Please follow instruction sheet, as given.  2. Your physician has recommended that you wear a 48 HOUR holter monitor. Holter monitors are medical devices that record the heart's electrical activity. Doctors most often use these monitors to diagnose arrhythmias. Arrhythmias are problems with the speed or rhythm of the heartbeat. The monitor is a small, portable device. You can wear one while you do your normal daily activities. This is usually used to diagnose what is causing palpitations/syncope (passing out).    Follow-Up:  DR. Marlou Porch 1 WEEK AFTER STRESS TEST HAS BEEN COMPLETED  Any Other Special Instructions Will Be Listed Below (If Applicable). If you need a refill on your cardiac medications before your next appointment, please call your pharmacy.   Low-Sodium Eating Plan Sodium, which is an element that makes up salt, helps you maintain a healthy balance of fluids in your body. Too much sodium can increase your blood pressure and cause fluid and waste to be held in your body. Your health care provider or dietitian may recommend following this plan if you have high blood pressure (hypertension), kidney disease, liver disease, or heart failure. Eating less sodium can help lower your blood pressure, reduce swelling, and protect your heart, liver,  and kidneys. What are tips for following this plan? General guidelines  Most people on this plan should limit their sodium intake to 1,500-2,000 mg (milligrams) of sodium each day. Reading food labels  The Nutrition Facts label lists the amount of sodium in one serving of the food. If you eat more than one serving, you must multiply the listed amount of sodium by the number of servings.  Choose foods with less than 140 mg of sodium per serving.  Avoid foods with 300 mg of sodium or more per serving. Shopping  Look for lower-sodium products, often labeled as "low-sodium" or "no salt added."  Always check the sodium content even if foods are labeled as "unsalted" or "no salt added".  Buy fresh foods. ? Avoid canned foods and premade or frozen meals. ? Avoid canned, cured, or processed meats  Buy breads that have less than 80 mg of sodium per slice. Cooking  Eat more home-cooked food and less restaurant, buffet, and fast food.  Avoid adding salt when cooking. Use salt-free seasonings or herbs instead of table salt or sea salt. Check with your health care provider or pharmacist before using salt substitutes.  Cook with plant-based oils, such as canola, sunflower, or olive oil. Meal planning  When eating at a restaurant, ask that your food be prepared with less salt or no salt, if possible.  Avoid foods that contain MSG (monosodium glutamate). MSG is sometimes added to [place] food, bouillon, and some canned foods. What foods are recommended? The items listed may not be a complete list. Talk with your dietitian about what dietary choices are best for you. Grains Low-sodium cereals, including oats, puffed wheat and rice, and shredded wheat. Low-sodium crackers. Unsalted rice. Unsalted pasta. Low-sodium bread. Whole-grain breads and whole-grain pasta. Vegetables Fresh or frozen vegetables. "No salt added" canned vegetables. "No salt added" tomato sauce and paste. Low-sodium or  reduced-sodium tomato and vegetable juice. Fruits Fresh, frozen, or canned fruit. Fruit juice. Meats and other protein foods Fresh or frozen (no salt added) meat, poultry, seafood, and fish. Low-sodium canned tuna and salmon. Unsalted nuts. Dried peas, beans, and lentils without added salt. Unsalted canned beans. Eggs. Unsalted nut butters. Dairy Milk. Soy milk. Cheese that is naturally low in sodium, such as ricotta cheese, fresh mozzarella, or Swiss cheese Low-sodium or reduced-sodium cheese. Cream cheese. Yogurt. Fats and oils Unsalted butter. Unsalted margarine with no trans fat. Vegetable oils such as canola or olive oils. Seasonings and other foods Fresh and dried herbs and spices. Salt-free seasonings. Low-sodium mustard and ketchup. Sodium-free salad dressing. Sodium-free light mayonnaise. Fresh or refrigerated horseradish. Lemon juice. Vinegar. Homemade, reduced-sodium, or low-sodium soups. Unsalted popcorn and pretzels. Low-salt or salt-free chips. What foods are not recommended? The items listed may not be a complete  list. Talk with your dietitian about what dietary choices are best for you. Grains Instant hot cereals. Bread stuffing, pancake, and biscuit mixes. Croutons. Seasoned rice or pasta mixes. Noodle soup cups. Boxed or frozen macaroni and cheese. Regular salted crackers. Self-rising flour. Vegetables Sauerkraut, pickled vegetables, and relishes. Olives. [place] fries. Onion rings. Regular canned vegetables (not low-sodium or reduced-sodium). Regular canned tomato sauce and paste (not low-sodium or reduced-sodium). Regular tomato and vegetable juice (not low-sodium or reduced-sodium). Frozen vegetables in sauces. Meats and other protein foods Meat or fish that is salted, canned, smoked, spiced, or pickled. Bacon, ham, sausage, hotdogs, corned beef, chipped beef, packaged lunch meats, salt pork, jerky, pickled herring, anchovies, regular canned tuna, sardines, salted  nuts. Dairy Processed cheese and cheese spreads. Cheese curds. Blue cheese. Feta cheese. String cheese. Regular cottage cheese. Buttermilk. Canned milk. Fats and oils Salted butter. Regular margarine. Ghee. Bacon fat. Seasonings and other foods Onion salt, garlic salt, seasoned salt, table salt, and sea salt. Canned and packaged gravies. Worcestershire sauce. Tartar sauce. Barbecue sauce. Teriyaki sauce. Soy sauce, including reduced-sodium. Steak sauce. Fish sauce. Oyster sauce. Cocktail sauce. Horseradish that you find on the shelf. Regular ketchup and mustard. Meat flavorings and tenderizers. Bouillon cubes. Hot sauce and Tabasco sauce. Premade or packaged marinades. Premade or packaged taco seasonings. Relishes. Regular salad dressings. Salsa. Potato and tortilla chips. Corn chips and puffs. Salted popcorn and pretzels. Canned or dried soups. Pizza. Frozen entrees and pot pies. Summary  Eating less sodium can help lower your blood pressure, reduce swelling, and protect your heart, liver, and kidneys.  Most people on this plan should limit their sodium intake to 1,500-2,000 mg (milligrams) of sodium each day.  Canned, boxed, and frozen foods are high in sodium. Restaurant foods, fast foods, and pizza are also very high in sodium. You also get sodium by adding salt to food.  Try to cook at home, eat more fresh fruits and vegetables, and eat less fast food, canned, processed, or prepared foods. This information is not intended to replace advice given to you by your health care provider. Make sure you discuss any questions you have with your health care provider. Document Released: 03/18/2002 Document Revised: 09/19/2016 Document Reviewed: 09/19/2016 Elsevier Interactive Patient Education  2017 [place], Ermalinda Barrios, [place]  04/24/2017 10:46 AM    [facility] [place], [place], [place]  [address] Phone: [phone]; Fax: [phone]

## 2017-04-28 DIAGNOSIS — I10 Essential (primary) hypertension: Secondary | ICD-10-CM | POA: Diagnosis not present

## 2017-05-02 ENCOUNTER — Telehealth (HOSPITAL_COMMUNITY): Payer: Self-pay | Admitting: *Deleted

## 2017-05-02 NOTE — Telephone Encounter (Signed)
Patient's daughter, per DPRt given detailed instructions per Myocardial Perfusion Study Information Sheet for the test on  05/04/17. Patient notified to arrive 15 minutes early and that it is imperative to arrive on time for appointment to keep from having the test rescheduled.  If you need to cancel or reschedule your appointment, please call the office within 24 hours of your appointment. . Patient verbalized understanding. Kirstie Peri

## 2017-05-04 ENCOUNTER — Ambulatory Visit (HOSPITAL_COMMUNITY): Payer: BLUE CROSS/BLUE SHIELD | Attending: Cardiology

## 2017-05-04 DIAGNOSIS — R079 Chest pain, unspecified: Secondary | ICD-10-CM

## 2017-05-04 LAB — MYOCARDIAL PERFUSION IMAGING
CHL CUP NUCLEAR SDS: 1
CHL CUP RESTING HR STRESS: 55 {beats}/min
CSEPPHR: 155 {beats}/min
Estimated workload: 9.3 METS
Exercise duration (min): 7 min
Exercise duration (sec): 30 s
LV sys vol: 71 mL
LVDIAVOL: 152 mL (ref 62–150)
MPHR: 146 {beats}/min
NUC STRESS TID: 0.79
Percent HR: 106 %
RATE: 0.23
RPE: 19
SRS: 3
SSS: 4

## 2017-05-04 MED ORDER — TECHNETIUM TC 99M TETROFOSMIN IV KIT
30.8000 | PACK | Freq: Once | INTRAVENOUS | Status: AC | PRN
  Administered 2017-05-04: 30.8 via INTRAVENOUS
  Filled 2017-05-04: qty 31

## 2017-05-04 MED ORDER — TECHNETIUM TC 99M TETROFOSMIN IV KIT
10.2000 | PACK | Freq: Once | INTRAVENOUS | Status: AC | PRN
  Administered 2017-05-04: 10.2 via INTRAVENOUS
  Filled 2017-05-04: qty 11

## 2017-05-04 NOTE — Progress Notes (Unsigned)
Zacarias Pontes Interpreter present for test- M. Sow

## 2017-05-05 ENCOUNTER — Encounter: Payer: Self-pay | Admitting: *Deleted

## 2017-05-05 NOTE — Progress Notes (Signed)
Patient ID: Shane Vargas, male   DOB: 06-15-1941, 76 y.o.   MRN: [id number] Patient did not show up for 05/05/17 appointment, to have a 48 hour holter monitor applied.

## 2017-05-09 ENCOUNTER — Telehealth: Payer: Self-pay | Admitting: *Deleted

## 2017-05-09 NOTE — Telephone Encounter (Signed)
-----   Message from Imogene Burn, PA-C sent at 05/08/2017  8:03 AM EDT ----- Stress test normal

## 2017-05-09 NOTE — Telephone Encounter (Signed)
DPR ok to s/w pt's daughter Merwyn Katos who has been notified of Myoview results by phone with verbal understanding.

## 2017-05-29 DIAGNOSIS — H401133 Primary open-angle glaucoma, bilateral, severe stage: Secondary | ICD-10-CM | POA: Diagnosis not present

## 2017-06-02 NOTE — Progress Notes (Signed)
Cardiology Office Note    Date:  06/05/2017   ID:  Shane Vargas, DOB 06-22-1941, MRN [id number]  PCP:  System, Pcp Not In  Cardiologist:  Dr. Marlou Porch  Chief Complaint: Stress test follow up  History of Present Illness:   Shane Vargas is a 76 y.o. male HTN, HLD, PVCs, CKD stage III  and chronic diastolic CHF presented for follow up.   History of nuclear stress test 02/16/09 showing no ischemia LVEF 42%. Echo 01/19/09 EF 42% with diastolic dysfunction, Holter 01/16/09 frequent PVCs.  Seen by APP for palpitation and chest pain 04/24/17. Follow up stress test low risk. Pending 48 hour monitor.   Here today for follow up. He speaks [place]. Shane Vargas provided translation. Continues to have intermittent chest pain. He described pain as a burning sensation. Occasional worse with bending. Last episode approximately 2 weeks ago. No further episode of palpitation since seen by APP. Denies lower extremity edema. He does eats salty/spicy sputum.   Past Medical History:  Diagnosis Date  . Benign hypertensive heart disease without heart failure   . Cardiomyopathy [facility])    Nuclear stress test- 02/16/09 - 7 minutes 6 seconds, dilated LV, subtle decreased uptake apical region, no ischemia, EF 42%   . Hypertension   . Irregular heart beat    Echocardiogram -6/83/41-DQ 22%, diastolic dysfunction, trivial valvular lesions  . Nonspecific abnormal unspecified cardiovascular function study   . Osteoarthritis   . Pain in joint, multiple sites   . Palpitation    Holter-01/16/09-frequent PVCs/couplets, one triplet (NSVT). 8 PACs. Average heart rate 82 beats per minute    Past Surgical History:  Procedure Laterality Date  . APPENDECTOMY      Current Medications: Prior to Admission medications   Medication Sig Start Date End Date Taking? Authorizing Provider  atorvastatin (LIPITOR) 40 MG tablet Take 40 mg by mouth daily. 01/31/17   [provider]  metoprolol succinate (TOPROL-XL) 50 MG 24 hr tablet Take  50 mg by mouth daily. Take with or immediately following a meal.    [provider]  valsartan-hydrochlorothiazide (DIOVAN-HCT) 320-25 MG tablet Take 1 tablet by mouth daily.    [provider]    Allergies:   Caine-1 [lidocaine]   Social History   Social History  . Marital status: Single    Spouse name: N/A  . Number of children: N/A  . Years of education: N/A   Social History Main Topics  . Smoking status: Never Smoker  . Smokeless tobacco: Never Used  . Alcohol use No  . Drug use: No  . Sexual activity: Not Currently   Other Topics Concern  . None   Social History Narrative  . None     Family History:  The patient's family history includes Heart disease (age of onset: 78) in his father.   ROS:   Please see the history of present illness.    ROS All other systems reviewed and are negative.   PHYSICAL EXAM:   VS:  BP 120/60   Pulse 63   Ht 5\' 8"  (1.727 m)   Wt 227 lb (103 kg)   SpO2 99%   BMI 34.52 kg/m    GEN: Well nourished, well developed, in no acute distress  HEENT: normal  Neck: no JVD, carotid bruits, or masses Cardiac: RRR; no murmurs, rubs, or gallops,no edema  Respiratory:  clear to auscultation bilaterally, normal work of breathing GI: soft, nontender, nondistended, + BS MS: no deformity or atrophy  Skin: warm and  dry, no rash Neuro:  Alert and Oriented x 3, Strength and sensation are intact Psych: euthymic mood, full affect  Wt Readings from Last 3 Encounters:  06/05/17 227 lb (103 kg)  05/04/17 225 lb (102.1 kg)  04/24/17 225 lb (102.1 kg)      Studies/Labs Reviewed:   EKG:  EKG is not ordered today.   Recent Labs: No results found for requested labs within last 8760 hours.   Lipid Panel No results found for: CHOL, TRIG, HDL, CHOLHDL, VLDL, LDLCALC, LDLDIRECT  Additional studies/ records that were reviewed today include:   Stress test 05/04/17  Nuclear stress EF: 54%.  Blood pressure demonstrated a  hypertensive response to exercise.  There was no ST segment deviation noted during stress.  No T wave inversion was noted during stress.  The study is normal.   Low risk stress nuclear study with normal perfusion and normal left ventricular regional and global systolic function.    ASSESSMENT & PLAN:    1. Chest pain - Atypical. Recent stress test low risk. Likely due to untreated acid reflux. 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 No further cardiac testing needed. Start PPI.  2. HTN - Stable and well controlled on current regimen. Recently Valsartan is recalled. Will discontinue. Start losartan/HCTZ. Keep log.   3. HLD - On statin. Per PCP.   4. Palpitation - No reoccurrence after initial episode. No need for event monitor at this time. He will give [place] a call if worsening symptoms.  Discussed and reviewed with Dr. Marlou Porch. His symptoms is wage and non cardiac. Start Protonix. Recent stress test negative. Follow-up with primary care provider in 2 weeks for noncardiac evaluation of his chest pain and blood pressure.    Medication Adjustments/Labs and Tests Ordered: Current medicines are reviewed at length with the patient today.  Concerns regarding medicines are outlined above.  Medication changes, Labs and Tests ordered today are listed in the Patient Instructions below. Patient Instructions  Medication Instructions:  STOP Diovan START Losartan/HCTZ 100/25mg  Take 1 tab once a day START Protonix 40mg  Take 1 tablet once a day  Labwork: None   Testing/Procedures: None   Follow-Up: Follow up AS NEEDED CONTACT YOUR PCP FOR A FOLLOW UP APPOINTMENT IN TWO WEEKS  Any Other Special Instructions Will Be Listed Below (If Applicable).  If you need a refill on your cardiac medications before your next appointment, please call your pharmacy.     Jarrett Soho, [place]  06/05/2017 12:09 PM    [facility] [place], [place], [place]  [address] Phone: [phone]; Fax: [phone]

## 2017-06-05 ENCOUNTER — Encounter: Payer: Self-pay | Admitting: Physician Assistant

## 2017-06-05 ENCOUNTER — Ambulatory Visit (INDEPENDENT_AMBULATORY_CARE_PROVIDER_SITE_OTHER): Payer: BLUE CROSS/BLUE SHIELD | Admitting: Physician Assistant

## 2017-06-05 VITALS — BP 120/60 | HR 63 | Ht 68.0 in | Wt 227.0 lb

## 2017-06-05 DIAGNOSIS — R002 Palpitations: Secondary | ICD-10-CM | POA: Diagnosis not present

## 2017-06-05 DIAGNOSIS — E785 Hyperlipidemia, unspecified: Secondary | ICD-10-CM | POA: Diagnosis not present

## 2017-06-05 DIAGNOSIS — I10 Essential (primary) hypertension: Secondary | ICD-10-CM

## 2017-06-05 DIAGNOSIS — R079 Chest pain, unspecified: Secondary | ICD-10-CM | POA: Diagnosis not present

## 2017-06-05 DIAGNOSIS — K219 Gastro-esophageal reflux disease without esophagitis: Secondary | ICD-10-CM

## 2017-06-05 MED ORDER — LOSARTAN POTASSIUM-HCTZ 100-25 MG PO TABS: 1.0000 | ORAL_TABLET | Freq: Every day | ORAL | 2 refills | Status: DC

## 2017-06-05 MED ORDER — PANTOPRAZOLE SODIUM 40 MG PO TBEC: 40.0000 mg | DELAYED_RELEASE_TABLET | Freq: Every day | ORAL | 3 refills | Status: DC

## 2017-06-05 NOTE — Patient Instructions (Addendum)
Medication Instructions:  STOP Diovan START Losartan/HCTZ 100/25mg  Take 1 tab once a day START Protonix 40mg  Take 1 tablet once a day  Labwork: None   Testing/Procedures: None   Follow-Up: Follow up AS NEEDED CONTACT YOUR PCP FOR A FOLLOW UP APPOINTMENT IN TWO WEEKS  Any Other Special Instructions Will Be Listed Below (If Applicable).  If you need a refill on your cardiac medications before your next appointment, please call your pharmacy.

## 2017-09-12 DIAGNOSIS — Z23 Encounter for immunization: Secondary | ICD-10-CM | POA: Diagnosis not present

## 2017-09-12 DIAGNOSIS — E7800 Pure hypercholesterolemia, unspecified: Secondary | ICD-10-CM | POA: Diagnosis not present

## 2017-09-12 DIAGNOSIS — I10 Essential (primary) hypertension: Secondary | ICD-10-CM | POA: Diagnosis not present

## 2017-09-12 DIAGNOSIS — N183 Chronic kidney disease, stage 3 (moderate): Secondary | ICD-10-CM | POA: Diagnosis not present

## 2018-05-13 IMAGING — NM NM MISC PROCEDURE
6 series · 36 of 36 positions shown · non-contrast
Comparison: none

[Series 1: wbr_s-proj_st stress-gsp · 6.40mm/px · 6 of 512 frames shown]
[frame 43/512]
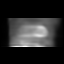
[frame 128/512]
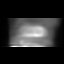
[frame 214/512]
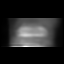
[frame 299/512]
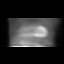
[frame 384/512]
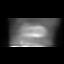
[frame 470/512]
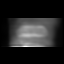

[Series 1: wbr_r-proj_st rest · 6.40mm/px · 6 of 64 frames shown]
[frame 6/64]
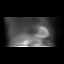
[frame 16/64]
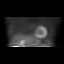
[frame 27/64]
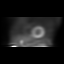
[frame 38/64]
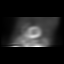
[frame 48/64]
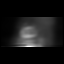
[frame 59/64]
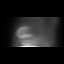

[Series 1: stress-sum-em · 6.40mm/px · 6 of 64 frames shown]
[frame 6/64]
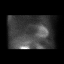
[frame 16/64]
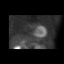
[frame 27/64]
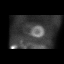
[frame 38/64]
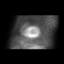
[frame 48/64]
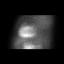
[frame 59/64]
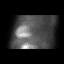

[Series 1: wbr_s-proj_st stress-sum-em · 6.40mm/px · 6 of 64 frames shown]
[frame 6/64]
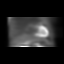
[frame 16/64]
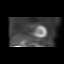
[frame 27/64]
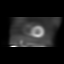
[frame 38/64]
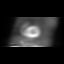
[frame 48/64]
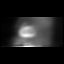
[frame 59/64]
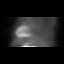

[Series 1: stress-gsp · 6.40mm/px · 6 of 511 frames shown]
[frame 43/511  full-range]
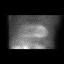
[frame 128/511  full-range]
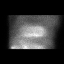
[frame 213/511  full-range]
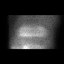
[frame 298/511  full-range]
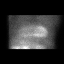
[frame 383/511  full-range]
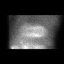
[frame 469/511  full-range]
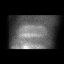

[Series 1: rest · 6.40mm/px · 6 of 64 frames shown]
[frame 6/64]
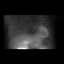
[frame 16/64]
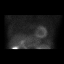
[frame 27/64]
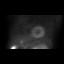
[frame 38/64]
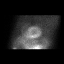
[frame 48/64]
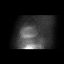
[frame 59/64]
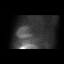

[36 of 36 positions shown; findings below may reference images not displayed]

Canned report from images found in remote index.

Refer to host system for actual result text.

## 2018-06-22 ENCOUNTER — Other Ambulatory Visit: Payer: Self-pay | Admitting: Physician Assistant

## 2018-07-26 ENCOUNTER — Other Ambulatory Visit: Payer: Self-pay | Admitting: Physician Assistant

## 2018-08-23 ENCOUNTER — Other Ambulatory Visit: Payer: Self-pay | Admitting: Physician Assistant

## 2018-09-28 ENCOUNTER — Ambulatory Visit (INDEPENDENT_AMBULATORY_CARE_PROVIDER_SITE_OTHER): Payer: BLUE CROSS/BLUE SHIELD | Admitting: Cardiology

## 2018-09-28 ENCOUNTER — Encounter: Payer: Self-pay | Admitting: Cardiology

## 2018-09-28 VITALS — BP 140/80 | HR 62 | Ht 68.0 in | Wt 225.8 lb

## 2018-09-28 DIAGNOSIS — I10 Essential (primary) hypertension: Secondary | ICD-10-CM

## 2018-09-28 DIAGNOSIS — E785 Hyperlipidemia, unspecified: Secondary | ICD-10-CM

## 2018-09-28 DIAGNOSIS — N183 Chronic kidney disease, stage 3 unspecified: Secondary | ICD-10-CM

## 2018-09-28 DIAGNOSIS — R002 Palpitations: Secondary | ICD-10-CM | POA: Diagnosis not present

## 2018-09-28 DIAGNOSIS — Z79899 Other long term (current) drug therapy: Secondary | ICD-10-CM

## 2018-09-28 MED ORDER — VALSARTAN-HYDROCHLOROTHIAZIDE 320-25 MG PO TABS: 1.0000 | ORAL_TABLET | Freq: Every day | ORAL | 3 refills | Status: AC

## 2018-09-28 MED ORDER — PANTOPRAZOLE SODIUM 40 MG PO TBEC: 40.0000 mg | DELAYED_RELEASE_TABLET | Freq: Every day | ORAL | 3 refills | Status: DC

## 2018-09-28 MED ORDER — METOPROLOL SUCCINATE ER 50 MG PO TB24: 50.0000 mg | ORAL_TABLET | Freq: Every day | ORAL | 3 refills | Status: AC

## 2018-09-28 MED ORDER — ATORVASTATIN CALCIUM 40 MG PO TABS: 40.0000 mg | ORAL_TABLET | Freq: Every day | ORAL | 3 refills | Status: AC

## 2018-09-28 NOTE — Patient Instructions (Signed)
Medication Instructions:  The current medical regimen is effective;  continue present plan and medications.  If you need a refill on your cardiac medications before your next appointment, please call your pharmacy.   Lab work: Please have  Blood work today (Lipid, ALT, CBC and BMP). If you have labs (blood work) drawn today and your tests are completely normal, you will receive your results only by: Marland Kitchen MyChart Message (if you have MyChart) OR . A paper copy in the mail If you have any lab test that is abnormal or we need to change your treatment, we will call you to review the results.  Follow-Up: At [facility], you and your health needs are our priority.  As part of our continuing mission to provide you with exceptional heart care, we have created designated Provider Care Teams.  These Care Teams include your primary Cardiologist (physician) and Advanced Practice Providers (APPs -  Physician Assistants and Nurse Practitioners) who all work together to provide you with the care you need, when you need it. You will need a follow up appointment in 12 months.  Please call our office 2 months in advance to schedule this appointment.  You may see Dr Candee Furbish or one of the following Advanced Practice Providers on your designated Care Team:   Truitt Merle, NP Cecilie Kicks, NP . Kathyrn Drown, NP  Thank you for choosing [facility]!!

## 2018-09-28 NOTE — Progress Notes (Signed)
Cardiology Office Note:    Date:  09/28/2018   ID:  Shane Vargas, DOB Oct 14, 1940, MRN [id number]  PCP:  System, Pcp Not In  Cardiologist:  No primary care provider on file.  Electrophysiologist:  None   Referring MD: No ref. provider found     History of Present Illness:    Shane Vargas is a 77 y.o. male here for follow-up of chronic diastolic heart failure hypertension hyperlipidemia PVCs CKD stage III.  Nuclear stress test in 2018 showed no ischemia 54% EF.  Holter monitor frequent PVCs.  Was last seen on 06/05/2017 for follow-up lower stress test and Holter monitor.  Speaks [place].  Intermittent burning chest discomfort.  Overall he has been doing quite well.  The Protonix has helped him out significantly.  He would like to continue.  Daughter is here to help with translation.  Chest pain has abated.  No fevers.  No myalgias on statin.  No bleeding.  Past Medical History:  Diagnosis Date  . Benign hypertensive heart disease without heart failure   . Cardiomyopathy [facility])    Nuclear stress test- 02/16/09 - 7 minutes 6 seconds, dilated LV, subtle decreased uptake apical region, no ischemia, EF 42%   . Hypertension   . Irregular heart beat    Echocardiogram -8/67/67-[facility] 94%, diastolic dysfunction, trivial valvular lesions  . Nonspecific abnormal unspecified cardiovascular function study   . Osteoarthritis   . Pain in joint, multiple sites   . Palpitation    Holter-01/16/09-frequent PVCs/couplets, one triplet (NSVT). 8 PACs. Average heart rate 82 beats per minute    Past Surgical History:  Procedure Laterality Date  . APPENDECTOMY      Current Medications: Current Meds  Medication Sig  . atorvastatin (LIPITOR) 40 MG tablet Take 1 tablet (40 mg total) by mouth daily.  . metoprolol succinate (TOPROL-XL) 50 MG 24 hr tablet Take 1 tablet (50 mg total) by mouth daily. Take with or immediately following a meal.  . pantoprazole (PROTONIX) 40 MG tablet Take 1 tablet (40 mg total) by mouth  daily.  . [DISCONTINUED] atorvastatin (LIPITOR) 40 MG tablet Take 40 mg by mouth daily.  . [DISCONTINUED] losartan-hydrochlorothiazide (HYZAAR) 100-25 MG tablet Take 1 tablet by mouth daily.  . [DISCONTINUED] metoprolol succinate (TOPROL-XL) 50 MG 24 hr tablet Take 50 mg by mouth daily. Take with or immediately following a meal.  . [DISCONTINUED] pantoprazole (PROTONIX) 40 MG tablet Take 1 tablet (40 mg total) by mouth daily. Please schedule appt for future refills. 3rd attempt.     Allergies:   Caine-1 [lidocaine]   Social History   Socioeconomic History  . Marital status: Single    Spouse name: Not on file  . Number of children: Not on file  . Years of education: Not on file  . Highest education level: Not on file  Occupational History  . Not on file  Social Needs  . Financial resource strain: Not on file  . Food insecurity:    Worry: Not on file    Inability: Not on file  . Transportation needs:    Medical: Not on file    Non-medical: Not on file  Tobacco Use  . Smoking status: Never Smoker  . Smokeless tobacco: Never Used  Substance and Sexual Activity  . Alcohol use: No  . Drug use: No  . Sexual activity: Not Currently  Lifestyle  . Physical activity:    Days per week: Not on file    Minutes per session: Not on file  .  Stress: Not on file  Relationships  . Social connections:    Talks on phone: Not on file    Gets together: Not on file    Attends religious service: Not on file    Active member of club or organization: Not on file    Attends meetings of clubs or organizations: Not on file    Relationship status: Not on file  Other Topics Concern  . Not on file  Social History Narrative  . Not on file     Family History: The patient's family history includes Heart disease (age of onset: 31) in his father.  ROS:   Please see the history of present illness.    Denies any fevers chills nausea vomiting syncope bleeding.  He does have some excessive fatigue  back pain cough all other systems reviewed and are negative.  EKGs/Labs/Other Studies Reviewed:    The following studies were reviewed today: Prior office notes, lab work, EKG reviewed.  EKG:  EKG is  ordered today.  The ekg ordered today demonstrates sinus rhythm 62 no other significant abnormalities personally reviewed.  Previous EKG sinus bradycardia 57.  Recent Labs: No results found for requested labs within last 8760 hours.  Recent Lipid Panel No results found for: CHOL, TRIG, HDL, CHOLHDL, VLDL, LDLCALC, LDLDIRECT  Physical Exam:    VS:  BP 140/80   Pulse 62   Ht 5\' 8"  (1.727 m)   Wt 225 lb 12.8 oz (102.4 kg)   BMI 34.33 kg/m     Wt Readings from Last 3 Encounters:  09/28/18 225 lb 12.8 oz (102.4 kg)  06/05/17 227 lb (103 kg)  05/04/17 225 lb (102.1 kg)     GEN:  Well nourished, well developed in no acute distress HEENT: Normal NECK: No JVD; No carotid bruits LYMPHATICS: No lymphadenopathy CARDIAC: RRR, no murmurs, rubs, gallops RESPIRATORY:  Clear to auscultation without rales, wheezing or rhonchi  ABDOMEN: Soft, non-tender, non-distended MUSCULOSKELETAL:  No edema; No deformity  SKIN: Warm and dry NEUROLOGIC:  Alert and oriented x 3 PSYCHIATRIC:  Normal affect   ASSESSMENT:    1. Essential hypertension   2. Hyperlipidemia, unspecified hyperlipidemia type   3. Long-term use of high-risk medication   4. CKD (chronic kidney disease) stage 3, GFR 30-59 ml/min (HCC)   5. Palpitations    PLAN:    In order of problems listed above:  Atypical chest pain -2018 stress test low risk.  Felt to be possible GERD.  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  No further cardiac testing at this time.  PPI was started.  He is feeling much better on the Protonix.  He would like to continue this.  We will refill.  Essential hypertension -Stable, valsartan HCTZ.  Monitor.  We will check basic metabolic profile and CBC.  Hyperlipidemia - Statin as above.   Atorvastatin.  We will check lipid panel and ALT.  Palpitations - Prior Holter monitor in 2010 showed frequent PVCs.  Symptoms have abated. -Continue with metoprolol.   Medication Adjustments/Labs and Tests Ordered: Current medicines are reviewed at length with the patient today.  Concerns regarding medicines are outlined above.  Orders Placed This Encounter  Procedures  . CBC  . Basic metabolic panel  . ALT  . Lipid panel  . EKG 12-Lead   Meds ordered this encounter  Medications  . atorvastatin (LIPITOR) 40 MG tablet    Sig: Take 1 tablet (40 mg total) by mouth daily.    Dispense:  90 tablet    Refill:  3  . metoprolol succinate (TOPROL-XL) 50 MG 24 hr tablet    Sig: Take 1 tablet (50 mg total) by mouth daily. Take with or immediately following a meal.    Dispense:  90 tablet    Refill:  3  . pantoprazole (PROTONIX) 40 MG tablet    Sig: Take 1 tablet (40 mg total) by mouth daily.    Dispense:  90 tablet    Refill:  3  . valsartan-hydrochlorothiazide (DIOVAN-HCT) 320-25 MG tablet    Sig: Take 1 tablet by mouth daily.    Dispense:  90 tablet    Refill:  3    Patient Instructions  Medication Instructions:  The current medical regimen is effective;  continue present plan and medications.  If you need a refill on your cardiac medications before your next appointment, please call your pharmacy.   Lab work: Please have  Blood work today (Lipid, ALT, CBC and BMP). If you have labs (blood work) drawn today and your tests are completely normal, you will receive your results only by: Marland Kitchen MyChart Message (if you have MyChart) OR . A paper copy in the mail If you have any lab test that is abnormal or we need to change your treatment, we will call you to review the results.  Follow-Up: At [facility], you and your health needs are our priority.  As part of our continuing mission to provide you with exceptional heart care, we have created designated Provider Care Teams.  These  Care Teams include your primary Cardiologist (physician) and Advanced Practice Providers (APPs -  Physician Assistants and Nurse Practitioners) who all work together to provide you with the care you need, when you need it. You will need a follow up appointment in 12 months.  Please call our office 2 months in advance to schedule this appointment.  You may see Dr Candee Furbish or one of the following Advanced Practice Providers on your designated Care Team:   Truitt Merle, NP Cecilie Kicks, NP . Kathyrn Drown, NP  Thank you for choosing [facility]!!          Signed, Candee Furbish, MD  09/28/2018 10:00 AM    [place]

## 2018-09-29 LAB — CBC
Hematocrit: 37.2 % — ABNORMAL LOW (ref 37.5–51.0)
Hemoglobin: 12.2 g/dL — ABNORMAL LOW (ref 13.0–17.7)
MCH: 28 pg (ref 26.6–33.0)
MCHC: 32.8 g/dL (ref 31.5–35.7)
MCV: 85 fL (ref 79–97)
PLATELETS: 151 10*3/uL (ref 150–450)
RBC: 4.36 x10E6/uL (ref 4.14–5.80)
RDW: 13.1 % (ref 12.3–15.4)
WBC: 6.3 10*3/uL (ref 3.4–10.8)

## 2018-09-29 LAB — LIPID PANEL
CHOL/HDL RATIO: 2.8 ratio (ref 0.0–5.0)
Cholesterol, Total: 130 mg/dL (ref 100–199)
HDL: 47 mg/dL (ref >39)
LDL CALC: 71 mg/dL (ref 0–99)
Triglycerides: 61 mg/dL (ref 0–149)
VLDL Cholesterol Cal: 12 mg/dL (ref 5–40)

## 2018-09-29 LAB — BASIC METABOLIC PANEL
BUN / CREAT RATIO: 17 (ref 10–24)
BUN: 42 mg/dL — ABNORMAL HIGH (ref 8–27)
CO2: 22 mmol/L (ref 20–29)
Calcium: 9.6 mg/dL (ref 8.6–10.2)
Chloride: 102 mmol/L (ref 96–106)
Creatinine, Ser: 2.53 mg/dL — ABNORMAL HIGH (ref 0.76–1.27)
GFR calc non Af Amer: 24 mL/min/{1.73_m2} — ABNORMAL LOW (ref >59)
GFR, EST AFRICAN AMERICAN: 27 mL/min/{1.73_m2} — AB (ref >59)
Glucose: 81 mg/dL (ref 65–99)
POTASSIUM: 5.1 mmol/L (ref 3.5–5.2)
SODIUM: 138 mmol/L (ref 134–144)

## 2018-09-29 LAB — ALT: ALT: 21 IU/L (ref 0–44)

## 2019-01-22 DIAGNOSIS — N2581 Secondary hyperparathyroidism of renal origin: Secondary | ICD-10-CM | POA: Diagnosis not present

## 2019-01-22 DIAGNOSIS — N183 Chronic kidney disease, stage 3 (moderate): Secondary | ICD-10-CM | POA: Diagnosis not present

## 2019-01-22 DIAGNOSIS — D631 Anemia in chronic kidney disease: Secondary | ICD-10-CM | POA: Diagnosis not present

## 2019-01-22 DIAGNOSIS — I129 Hypertensive chronic kidney disease with stage 1 through stage 4 chronic kidney disease, or unspecified chronic kidney disease: Secondary | ICD-10-CM | POA: Diagnosis not present

## 2019-01-22 DIAGNOSIS — N184 Chronic kidney disease, stage 4 (severe): Secondary | ICD-10-CM | POA: Diagnosis not present

## 2019-01-22 DIAGNOSIS — N189 Chronic kidney disease, unspecified: Secondary | ICD-10-CM | POA: Diagnosis not present

## 2019-01-29 ENCOUNTER — Other Ambulatory Visit: Payer: Self-pay | Admitting: Nephrology

## 2019-01-29 DIAGNOSIS — N184 Chronic kidney disease, stage 4 (severe): Secondary | ICD-10-CM

## 2019-02-11 ENCOUNTER — Other Ambulatory Visit: Payer: Self-pay

## 2019-02-11 ENCOUNTER — Ambulatory Visit
Admission: RE | Admit: 2019-02-11 | Discharge: 2019-02-11 | Disposition: A | Discharge status: Home / Self Care | Payer: BLUE CROSS/BLUE SHIELD | Source: Ambulatory Visit | Attending: Nephrology | Admitting: Nephrology

## 2019-02-11 DIAGNOSIS — N184 Chronic kidney disease, stage 4 (severe): Secondary | ICD-10-CM

## 2019-02-20 DIAGNOSIS — N184 Chronic kidney disease, stage 4 (severe): Secondary | ICD-10-CM | POA: Diagnosis not present

## 2019-02-20 DIAGNOSIS — D631 Anemia in chronic kidney disease: Secondary | ICD-10-CM | POA: Diagnosis not present

## 2019-02-20 DIAGNOSIS — N2581 Secondary hyperparathyroidism of renal origin: Secondary | ICD-10-CM | POA: Diagnosis not present

## 2019-02-20 DIAGNOSIS — I129 Hypertensive chronic kidney disease with stage 1 through stage 4 chronic kidney disease, or unspecified chronic kidney disease: Secondary | ICD-10-CM | POA: Diagnosis not present

## 2019-02-20 DIAGNOSIS — N189 Chronic kidney disease, unspecified: Secondary | ICD-10-CM | POA: Diagnosis not present

## 2019-03-14 DIAGNOSIS — B351 Tinea unguium: Secondary | ICD-10-CM | POA: Diagnosis not present

## 2019-03-27 DIAGNOSIS — I519 Heart disease, unspecified: Secondary | ICD-10-CM | POA: Diagnosis not present

## 2019-03-27 DIAGNOSIS — N183 Chronic kidney disease, stage 3 (moderate): Secondary | ICD-10-CM | POA: Diagnosis not present

## 2019-03-27 DIAGNOSIS — R600 Localized edema: Secondary | ICD-10-CM | POA: Diagnosis not present

## 2019-03-27 DIAGNOSIS — I129 Hypertensive chronic kidney disease with stage 1 through stage 4 chronic kidney disease, or unspecified chronic kidney disease: Secondary | ICD-10-CM | POA: Diagnosis not present

## 2019-04-04 DIAGNOSIS — N189 Chronic kidney disease, unspecified: Secondary | ICD-10-CM | POA: Diagnosis not present

## 2019-04-04 DIAGNOSIS — N184 Chronic kidney disease, stage 4 (severe): Secondary | ICD-10-CM | POA: Diagnosis not present

## 2019-04-04 DIAGNOSIS — I129 Hypertensive chronic kidney disease with stage 1 through stage 4 chronic kidney disease, or unspecified chronic kidney disease: Secondary | ICD-10-CM | POA: Diagnosis not present

## 2019-04-04 DIAGNOSIS — N2581 Secondary hyperparathyroidism of renal origin: Secondary | ICD-10-CM | POA: Diagnosis not present

## 2019-04-04 DIAGNOSIS — D631 Anemia in chronic kidney disease: Secondary | ICD-10-CM | POA: Diagnosis not present

## 2019-05-01 DIAGNOSIS — N183 Chronic kidney disease, stage 3 (moderate): Secondary | ICD-10-CM | POA: Diagnosis not present

## 2019-05-15 DIAGNOSIS — N184 Chronic kidney disease, stage 4 (severe): Secondary | ICD-10-CM | POA: Diagnosis not present

## 2019-05-27 DIAGNOSIS — L03115 Cellulitis of right lower limb: Secondary | ICD-10-CM | POA: Diagnosis not present

## 2019-05-27 DIAGNOSIS — N183 Chronic kidney disease, stage 3 (moderate): Secondary | ICD-10-CM | POA: Diagnosis not present

## 2019-07-03 DIAGNOSIS — N184 Chronic kidney disease, stage 4 (severe): Secondary | ICD-10-CM | POA: Diagnosis not present

## 2019-07-03 DIAGNOSIS — N189 Chronic kidney disease, unspecified: Secondary | ICD-10-CM | POA: Diagnosis not present

## 2019-07-03 DIAGNOSIS — Z1159 Encounter for screening for other viral diseases: Secondary | ICD-10-CM | POA: Diagnosis not present

## 2019-07-03 DIAGNOSIS — D631 Anemia in chronic kidney disease: Secondary | ICD-10-CM | POA: Diagnosis not present

## 2019-07-03 DIAGNOSIS — N2581 Secondary hyperparathyroidism of renal origin: Secondary | ICD-10-CM | POA: Diagnosis not present

## 2019-07-03 DIAGNOSIS — I129 Hypertensive chronic kidney disease with stage 1 through stage 4 chronic kidney disease, or unspecified chronic kidney disease: Secondary | ICD-10-CM | POA: Diagnosis not present

## 2019-08-12 DIAGNOSIS — N2581 Secondary hyperparathyroidism of renal origin: Secondary | ICD-10-CM | POA: Diagnosis not present

## 2019-08-12 DIAGNOSIS — D631 Anemia in chronic kidney disease: Secondary | ICD-10-CM | POA: Diagnosis not present

## 2019-08-12 DIAGNOSIS — I129 Hypertensive chronic kidney disease with stage 1 through stage 4 chronic kidney disease, or unspecified chronic kidney disease: Secondary | ICD-10-CM | POA: Diagnosis not present

## 2019-08-12 DIAGNOSIS — N184 Chronic kidney disease, stage 4 (severe): Secondary | ICD-10-CM | POA: Diagnosis not present

## 2019-08-20 DIAGNOSIS — Z23 Encounter for immunization: Secondary | ICD-10-CM | POA: Diagnosis not present

## 2019-08-20 DIAGNOSIS — N184 Chronic kidney disease, stage 4 (severe): Secondary | ICD-10-CM | POA: Diagnosis not present

## 2019-08-20 DIAGNOSIS — I5032 Chronic diastolic (congestive) heart failure: Secondary | ICD-10-CM | POA: Diagnosis not present

## 2019-08-20 DIAGNOSIS — R600 Localized edema: Secondary | ICD-10-CM | POA: Diagnosis not present

## 2019-09-24 DIAGNOSIS — N2581 Secondary hyperparathyroidism of renal origin: Secondary | ICD-10-CM | POA: Diagnosis not present

## 2019-09-27 ENCOUNTER — Other Ambulatory Visit: Payer: Self-pay | Admitting: Cardiology

## 2019-10-08 ENCOUNTER — Other Ambulatory Visit: Payer: Self-pay

## 2019-10-08 ENCOUNTER — Other Ambulatory Visit (HOSPITAL_COMMUNITY): Payer: Self-pay | Admitting: Family Medicine

## 2019-10-08 ENCOUNTER — Ambulatory Visit (HOSPITAL_COMMUNITY)
Admission: RE | Admit: 2019-10-08 | Discharge: 2019-10-08 | Disposition: A | Discharge status: Home / Self Care | Payer: BC Managed Care – PPO | Source: Ambulatory Visit | Attending: Family Medicine | Admitting: Family Medicine

## 2019-10-08 DIAGNOSIS — M7989 Other specified soft tissue disorders: Secondary | ICD-10-CM

## 2019-10-08 DIAGNOSIS — M79606 Pain in leg, unspecified: Secondary | ICD-10-CM

## 2019-10-08 DIAGNOSIS — M79604 Pain in right leg: Secondary | ICD-10-CM

## 2019-10-08 DIAGNOSIS — R609 Edema, unspecified: Secondary | ICD-10-CM | POA: Diagnosis not present

## 2019-10-08 NOTE — Progress Notes (Signed)
VASCULAR LAB PRELIMINARY  PRELIMINARY  PRELIMINARY  PRELIMINARY  Right lower extremity venous duplex  completed.    Preliminary report:  See CV proc for preliminary results.  Terrall Bley, RVT 10/08/2019, 4:29 PM

## 2019-10-10 ENCOUNTER — Other Ambulatory Visit: Payer: Self-pay | Admitting: Cardiology

## 2019-12-31 DIAGNOSIS — N2581 Secondary hyperparathyroidism of renal origin: Secondary | ICD-10-CM | POA: Diagnosis not present

## 2019-12-31 DIAGNOSIS — N184 Chronic kidney disease, stage 4 (severe): Secondary | ICD-10-CM | POA: Diagnosis not present

## 2019-12-31 DIAGNOSIS — N189 Chronic kidney disease, unspecified: Secondary | ICD-10-CM | POA: Diagnosis not present

## 2019-12-31 DIAGNOSIS — D631 Anemia in chronic kidney disease: Secondary | ICD-10-CM | POA: Diagnosis not present

## 2019-12-31 DIAGNOSIS — I129 Hypertensive chronic kidney disease with stage 1 through stage 4 chronic kidney disease, or unspecified chronic kidney disease: Secondary | ICD-10-CM | POA: Diagnosis not present

## 2020-02-10 ENCOUNTER — Ambulatory Visit: Payer: PPO | Attending: Internal Medicine

## 2020-02-10 DIAGNOSIS — Z20822 Contact with and (suspected) exposure to covid-19: Secondary | ICD-10-CM | POA: Diagnosis not present

## 2020-02-11 LAB — NOVEL CORONAVIRUS, NAA: SARS-CoV-2, NAA: DETECTED — AB

## 2020-02-11 LAB — SARS-COV-2, NAA 2 DAY TAT

## 2020-02-12 ENCOUNTER — Other Ambulatory Visit: Payer: Self-pay | Admitting: Infectious Diseases

## 2020-02-12 ENCOUNTER — Telehealth: Payer: Self-pay | Admitting: Infectious Diseases

## 2020-02-12 DIAGNOSIS — I10 Essential (primary) hypertension: Secondary | ICD-10-CM

## 2020-02-12 DIAGNOSIS — U071 COVID-19: Secondary | ICD-10-CM

## 2020-02-12 NOTE — Telephone Encounter (Signed)
Called to discuss with patient about Covid symptoms and the use of bamlanivimab, a monoclonal antibody infusion for those with mild to moderate Covid symptoms and at a high risk of hospitalization.  Pt is qualified for this infusion at the [facility] infusion center due to Age > 64.   [employer] used.   We discussed treatment with his wife, Shane Vargas. She states that her husband is doing very well overall but is having some body aches and fatigue. Symptoms started "some" days ago - goes on to clarify that it has just been a few days of symptoms now. No one else in the home is displaying any symptoms at this time.    Patient Active Problem List   Diagnosis Date Noted  . Chest pain 04/24/2017  . Palpitations 04/24/2017  . Essential hypertension 04/24/2017  . Hyperlipidemia 04/24/2017  . Diastolic dysfunction 90/21/1155  . CKD (chronic kidney disease) stage 3, GFR 30-59 ml/min 04/24/2017   The following was discussed with the patient's wife:  Transportation will be arranged for the patient  The address for the infusion clinic site is:  [place], [place] (previously this was the [place]   When you drive in the main entrance you will see security -  tell them they are there to get an infusion and they will take you to where you need to go.   If you have questions please call [phone].   Should you develop worsening shortness of breath, chest pain or severe breathing problems please do not wait for this appointment and go to the Emergency room for evaluation and treatment.   The day of your visit you should: Marland Kitchen Get plenty of rest the night before and drink plenty of water . Eat a light meal/snack before coming and take your medications as prescribed  . Wear warm, comfortable clothes with a shirt that can roll-up over the elbow (will need IV start).  . Wear a mask  . Consider bringing some activity to help pass the time

## 2020-02-12 NOTE — Progress Notes (Signed)
  I connected by phone with Shane Vargas on 02/12/2020 at 10:50 AM to discuss the potential use of an new treatment for mild to moderate COVID-19 viral infection in non-hospitalized patients.  This patient is a 79 y.o. male that meets the FDA criteria for Emergency Use Authorization of bamlanivimab/etesevimab or casirivimab/imdevimab.  Has a (+) direct SARS-CoV-2 viral test result  Has mild or moderate COVID-19   Is ? 78 years of age and weighs ? 40 kg  Is NOT hospitalized due to COVID-19  Is NOT requiring oxygen therapy or requiring an increase in baseline oxygen flow rate due to COVID-19  Is within 10 days of symptom onset  Has at least one of the high risk factor(s) for progression to severe COVID-19 and/or hospitalization as defined in EUA.  Specific high risk criteria : >/= 78 yo   I have spoken and communicated the following to the patient or parent/caregiver:  1. FDA has authorized the emergency use of bamlanivimab/etesevimab and casirivimab\imdevimab for the treatment of mild to moderate COVID-19 in adults and pediatric patients with positive results of direct SARS-CoV-2 viral testing who are 47 years of age and older weighing at least 40 kg, and who are at high risk for progressing to severe COVID-19 and/or hospitalization.  2. The significant known and potential risks and benefits of bamlanivimab/etesevimab and casirivimab\imdevimab, and the extent to which such potential risks and benefits are unknown.  3. Information on available alternative treatments and the risks and benefits of those alternatives, including clinical trials.  4. Patients treated with bamlanivimab/etesevimab and casirivimab\imdevimab should continue to self-isolate and use infection control measures (e.g., wear mask, isolate, social distance, avoid sharing personal items, clean and disinfect "high touch" surfaces, and frequent handwashing) according to CDC guidelines.   5. The patient or parent/caregiver has  the option to accept or refuse bamlanivimab/etesevimab or casirivimab\imdevimab .  After reviewing this information with the patient, The patient agreed to proceed with receiving the bamlanimivab infusion and will be provided a copy of the Fact sheet prior to receiving the infusion.Janene Madeira 02/12/2020 10:50 AM

## 2020-02-13 ENCOUNTER — Ambulatory Visit (HOSPITAL_COMMUNITY)
Admission: RE | Admit: 2020-02-13 | Discharge: 2020-02-13 | Disposition: A | Discharge status: Home / Self Care | Payer: Medicare Other | Source: Ambulatory Visit | Attending: Pulmonary Disease | Admitting: Pulmonary Disease

## 2020-02-13 DIAGNOSIS — U071 COVID-19: Secondary | ICD-10-CM | POA: Diagnosis not present

## 2020-02-13 DIAGNOSIS — Z23 Encounter for immunization: Secondary | ICD-10-CM | POA: Insufficient documentation

## 2020-02-13 DIAGNOSIS — I10 Essential (primary) hypertension: Secondary | ICD-10-CM | POA: Insufficient documentation

## 2020-02-13 MED ORDER — EPINEPHRINE 0.3 MG/0.3ML IJ SOAJ: 0.3000 mg | Freq: Once | INTRAMUSCULAR | Status: DC | PRN

## 2020-02-13 MED ORDER — FAMOTIDINE IN NACL 20-0.9 MG/50ML-% IV SOLN: 20.0000 mg | Freq: Once | INTRAVENOUS | Status: DC | PRN

## 2020-02-13 MED ORDER — SODIUM CHLORIDE 0.9 % IV SOLN: INTRAVENOUS | Status: DC | PRN

## 2020-02-13 MED ORDER — METHYLPREDNISOLONE SODIUM SUCC 125 MG IJ SOLR: 125.0000 mg | Freq: Once | INTRAMUSCULAR | Status: DC | PRN

## 2020-02-13 MED ORDER — DIPHENHYDRAMINE HCL 50 MG/ML IJ SOLN: 50.0000 mg | Freq: Once | INTRAMUSCULAR | Status: DC | PRN

## 2020-02-13 MED ORDER — ALBUTEROL SULFATE HFA 108 (90 BASE) MCG/ACT IN AERS: 2.0000 | INHALATION_SPRAY | Freq: Once | RESPIRATORY_TRACT | Status: DC | PRN

## 2020-02-13 MED ORDER — SODIUM CHLORIDE 0.9 % IV SOLN
Freq: Once | INTRAVENOUS | Status: AC
  Filled 2020-02-13: qty 700

## 2020-02-13 NOTE — Discharge Instructions (Signed)

## 2020-02-18 DIAGNOSIS — U071 COVID-19: Secondary | ICD-10-CM | POA: Diagnosis not present

## 2020-02-20 DIAGNOSIS — Z20828 Contact with and (suspected) exposure to other viral communicable diseases: Secondary | ICD-10-CM | POA: Diagnosis not present

## 2020-02-20 DIAGNOSIS — Z03818 Encounter for observation for suspected exposure to other biological agents ruled out: Secondary | ICD-10-CM | POA: Diagnosis not present

## 2020-04-07 DIAGNOSIS — D631 Anemia in chronic kidney disease: Secondary | ICD-10-CM | POA: Diagnosis not present

## 2020-04-07 DIAGNOSIS — I129 Hypertensive chronic kidney disease with stage 1 through stage 4 chronic kidney disease, or unspecified chronic kidney disease: Secondary | ICD-10-CM | POA: Diagnosis not present

## 2020-04-07 DIAGNOSIS — N2581 Secondary hyperparathyroidism of renal origin: Secondary | ICD-10-CM | POA: Diagnosis not present

## 2020-04-07 DIAGNOSIS — N184 Chronic kidney disease, stage 4 (severe): Secondary | ICD-10-CM | POA: Diagnosis not present

## 2020-04-07 DIAGNOSIS — N189 Chronic kidney disease, unspecified: Secondary | ICD-10-CM | POA: Diagnosis not present

## 2020-05-06 DIAGNOSIS — I129 Hypertensive chronic kidney disease with stage 1 through stage 4 chronic kidney disease, or unspecified chronic kidney disease: Secondary | ICD-10-CM | POA: Diagnosis not present

## 2020-05-06 DIAGNOSIS — M7989 Other specified soft tissue disorders: Secondary | ICD-10-CM | POA: Diagnosis not present

## 2020-05-06 DIAGNOSIS — L509 Urticaria, unspecified: Secondary | ICD-10-CM | POA: Diagnosis not present

## 2020-05-06 DIAGNOSIS — Z1389 Encounter for screening for other disorder: Secondary | ICD-10-CM | POA: Diagnosis not present

## 2020-05-06 DIAGNOSIS — R609 Edema, unspecified: Secondary | ICD-10-CM | POA: Diagnosis not present

## 2020-05-06 DIAGNOSIS — N1831 Chronic kidney disease, stage 3a: Secondary | ICD-10-CM | POA: Diagnosis not present

## 2020-05-06 DIAGNOSIS — Z0000 Encounter for general adult medical examination without abnormal findings: Secondary | ICD-10-CM | POA: Diagnosis not present

## 2020-05-06 DIAGNOSIS — E7800 Pure hypercholesterolemia, unspecified: Secondary | ICD-10-CM | POA: Diagnosis not present

## 2020-05-06 DIAGNOSIS — K219 Gastro-esophageal reflux disease without esophagitis: Secondary | ICD-10-CM | POA: Diagnosis not present

## 2020-06-01 DIAGNOSIS — D631 Anemia in chronic kidney disease: Secondary | ICD-10-CM | POA: Diagnosis not present

## 2020-06-01 DIAGNOSIS — N184 Chronic kidney disease, stage 4 (severe): Secondary | ICD-10-CM | POA: Diagnosis not present

## 2020-06-01 DIAGNOSIS — N189 Chronic kidney disease, unspecified: Secondary | ICD-10-CM | POA: Diagnosis not present

## 2020-06-01 DIAGNOSIS — N2581 Secondary hyperparathyroidism of renal origin: Secondary | ICD-10-CM | POA: Diagnosis not present

## 2020-06-01 DIAGNOSIS — I129 Hypertensive chronic kidney disease with stage 1 through stage 4 chronic kidney disease, or unspecified chronic kidney disease: Secondary | ICD-10-CM | POA: Diagnosis not present

## 2020-06-26 DIAGNOSIS — Z23 Encounter for immunization: Secondary | ICD-10-CM | POA: Diagnosis not present

## 2020-08-11 ENCOUNTER — Ambulatory Visit: Payer: PPO | Admitting: Cardiology

## 2020-08-11 ENCOUNTER — Encounter: Payer: Self-pay | Admitting: Cardiology

## 2020-08-11 VITALS — BP 130/72 | HR 59 | Ht 68.0 in | Wt 225.0 lb

## 2020-08-11 DIAGNOSIS — R002 Palpitations: Secondary | ICD-10-CM

## 2020-08-11 DIAGNOSIS — N1832 Chronic kidney disease, stage 3b: Secondary | ICD-10-CM

## 2020-08-11 DIAGNOSIS — I10 Essential (primary) hypertension: Secondary | ICD-10-CM | POA: Diagnosis not present

## 2020-08-11 DIAGNOSIS — E785 Hyperlipidemia, unspecified: Secondary | ICD-10-CM

## 2020-08-11 NOTE — Progress Notes (Signed)
Cardiology Office Note:    Date:  08/11/2020   ID:  Shane Vargas, DOB 1940-12-11, MRN [id number]  PCP:  Pcp, No  [facility] Cardiologist:  No primary care provider on file.  [facility] Electrophysiologist:  None   Referring MD: No ref. provider found     History of Present Illness:    Shane Vargas is a 79 y.o. male here for follow-up of chronic diastolic heart failure hypertension hyperlipidemia PVCs.  Overall been doing quite well.  Had Protonix helping out with GERD-like symptoms.  Prior nuclear stress test in 2018 showed no ischemia with normal EF.  Prior Holter monitor showed frequent PVCs.  Today, he is not complaining of any palpitations.  His EKG is normal without any PVCs.  He has been doing very well.  He has been seeing Dr. Justin Vargas in nephrology given his chronic kidney disease.  She has had lower extremity edema which is being managed with furosemide.  Amlodipine may be playing a role as well.  Past Medical History:  Diagnosis Date   Benign hypertensive heart disease without heart failure    Cardiomyopathy ([place])    Nuclear stress test- 02/16/09 - 7 minutes 6 seconds, dilated LV, subtle decreased uptake apical region, no ischemia, EF 42%    Hypertension    Irregular heart beat    Echocardiogram -5/46/50-PT 46%, diastolic dysfunction, trivial valvular lesions   Nonspecific abnormal unspecified cardiovascular function study    Osteoarthritis    Pain in joint, multiple sites    Palpitation    Holter-01/16/09-frequent PVCs/couplets, one triplet (NSVT). 8 PACs. Average heart rate 82 beats per minute    Past Surgical History:  Procedure Laterality Date   APPENDECTOMY      Current Medications: Current Meds  Medication Sig   amLODipine (NORVASC) 10 MG tablet Take 10 mg by mouth daily.   atorvastatin (LIPITOR) 40 MG tablet Take 1 tablet (40 mg total) by mouth daily.   dorzolamide-timolol (COSOPT) 22.3-6.8 MG/ML ophthalmic solution 1 drop daily.    furosemide (LASIX) 80 MG tablet Take 80 mg by mouth in the morning and at bedtime.   metoprolol succinate (TOPROL-XL) 50 MG 24 hr tablet Take 1 tablet (50 mg total) by mouth daily. Take with or immediately following a meal.   pantoprazole (PROTONIX) 40 MG tablet TAKE 1 TABLET BY MOUTH EVERY DAY   valsartan-hydrochlorothiazide (DIOVAN-HCT) 320-25 MG tablet Take 1 tablet by mouth daily.     Allergies:   Caine-1 [lidocaine]   Social History   Socioeconomic History   Marital status: Single    Spouse name: Not on file   Number of children: Not on file   Years of education: Not on file   Highest education level: Not on file  Occupational History   Not on file  Tobacco Use   Smoking status: Never Smoker   Smokeless tobacco: Never Used  Vaping Use   Vaping Use: Never used  Substance and Sexual Activity   Alcohol use: No   Drug use: No   Sexual activity: Not Currently  Other Topics Concern   Not on file  Social History Narrative   Not on file   Social Determinants of Health   Financial Resource Strain:    Difficulty of Paying Living Expenses: Not on file  Food Insecurity:    Worried About [place] in the Last Year: Not on file   Ran Out of Food in the Last Year: Not on file  Transportation Needs:  Lack of Transportation (Medical): Not on file   Lack of Transportation (Non-Medical): Not on file  Physical Activity:    Days of Exercise per Week: Not on file   Minutes of Exercise per Session: Not on file  Stress:    Feeling of Stress : Not on file  Social Connections:    Frequency of Communication with Friends and Family: Not on file   Frequency of Social Gatherings with Friends and Family: Not on file   Attends Religious Services: Not on file   Active Member of Clubs or Organizations: Not on file   Attends Archivist Meetings: Not on file   Marital Status: Not on file     Family History: The patient's family history  includes Heart disease (age of onset: 27) in his father.  ROS:   Please see the history of present illness.     All other systems reviewed and are negative.  EKGs/Labs/Other Studies Reviewed:    The following studies were reviewed today: 2020-lower extremity vascular venous ultrasound was negative  EKG:  EKG is  ordered today.  The ekg ordered today demonstrates sinus rhythm 59 no other changes  Recent Labs: No results found for requested labs within last 8760 hours.  Recent Lipid Panel    Component Value Date/Time   CHOL 130 09/28/2018 0931   TRIG 61 09/28/2018 0931   HDL 47 09/28/2018 0931   CHOLHDL 2.8 09/28/2018 0931   LDLCALC 71 09/28/2018 0931     Risk Assessment/Calculations:       Physical Exam:    VS:  BP 130/72    Pulse (!) 59    Ht 5\' 8"  (1.727 m)    Wt 225 lb (102.1 kg)    SpO2 100%    BMI 34.21 kg/m     Wt Readings from Last 3 Encounters:  08/11/20 225 lb (102.1 kg)  09/28/18 225 lb 12.8 oz (102.4 kg)  06/05/17 227 lb (103 kg)     GEN:  Well nourished, well developed in no acute distress HEENT: Normal NECK: No JVD; No carotid bruits LYMPHATICS: No lymphadenopathy CARDIAC: RRR, no murmurs, rubs, gallops RESPIRATORY:  Clear to auscultation without rales, wheezing or rhonchi  ABDOMEN: Soft, non-tender, non-distended MUSCULOSKELETAL:  2-3+ LE edema; No deformity  SKIN: Warm and dry NEUROLOGIC:  Alert and oriented x 3 PSYCHIATRIC:  Normal affect   ASSESSMENT:    1. Essential hypertension   2. Hyperlipidemia, unspecified hyperlipidemia type   3. Stage 3b chronic kidney disease (HCC)   4. Palpitations    PLAN:    In order of problems listed above:  Essential hypertension -Currently well controlled on amlodipine 10 furosemide 80 valsartan hydrochlorothiazide 320/25 mg.  Toprol 50 mg. -Feeling well.  Continue with current medical management.  Hyperlipidemia -Atorvastatin LDL 89, ALT 19, continue with current medical  management.  Palpitations -Prior monitoring has demonstrated PVCs. -On metoprolol.  Doing well.  EKG today without any PVCs.  No symptoms.  Chronic kidney disease -Seeing Dr. Justin Vargas.  Stage IV.  Creatinine 2.6. -Furosemide for lower extremity edema.  Post Covid infection -May 2021.  Medication Adjustments/Labs and Tests Ordered: Current medicines are reviewed at length with the patient today.  Concerns regarding medicines are outlined above.  Orders Placed This Encounter  Procedures   EKG 12-Lead   No orders of the defined types were placed in this encounter.   Patient Instructions  Medication Instructions:  The current medical regimen is effective;  continue present plan and medications.  *  If you need a refill on your cardiac medications before your next appointment, please call your pharmacy*  Follow-Up: At [facility], you and your health needs are our priority.  As part of our continuing mission to provide you with exceptional heart care, we have created designated Provider Care Teams.  These Care Teams include your primary Cardiologist (physician) and Advanced Practice Providers (APPs -  Physician Assistants and Nurse Practitioners) who all work together to provide you with the care you need, when you need it.  We recommend signing up for the patient portal called "MyChart".  Sign up information is provided on this After Visit Summary.  MyChart is used to connect with patients for Virtual Visits (Telemedicine).  Patients are able to view lab/test results, encounter notes, upcoming appointments, etc.  Non-urgent messages can be sent to your provider as well.   To learn more about what you can do with MyChart, go to [email or web address].    Your next appointment:   12 month(s)  The format for your next appointment:   In Person  Provider:   Candee Furbish, MD   Thank you for choosing [facility]!!        Signed, Candee Furbish, MD  08/11/2020 12:17 PM     [place]

## 2020-08-11 NOTE — Patient Instructions (Signed)
Medication Instructions:  The current medical regimen is effective;  continue present plan and medications.  *If you need a refill on your cardiac medications before your next appointment, please call your pharmacy*  Follow-Up: At [facility], you and your health needs are our priority.  As part of our continuing mission to provide you with exceptional heart care, we have created designated Provider Care Teams.  These Care Teams include your primary Cardiologist (physician) and Advanced Practice Providers (APPs -  Physician Assistants and Nurse Practitioners) who all work together to provide you with the care you need, when you need it.  We recommend signing up for the patient portal called "MyChart".  Sign up information is provided on this After Visit Summary.  MyChart is used to connect with patients for Virtual Visits (Telemedicine).  Patients are able to view lab/test results, encounter notes, upcoming appointments, etc.  Non-urgent messages can be sent to your provider as well.   To learn more about what you can do with MyChart, go to [email or web address].    Your next appointment:   12 month(s)  The format for your next appointment:   In Person  Provider:   Mark Skains, MD   Thank you for choosing [facility]!!      

## 2020-08-31 DIAGNOSIS — D631 Anemia in chronic kidney disease: Secondary | ICD-10-CM | POA: Diagnosis not present

## 2020-08-31 DIAGNOSIS — N2581 Secondary hyperparathyroidism of renal origin: Secondary | ICD-10-CM | POA: Diagnosis not present

## 2020-08-31 DIAGNOSIS — N189 Chronic kidney disease, unspecified: Secondary | ICD-10-CM | POA: Diagnosis not present

## 2020-08-31 DIAGNOSIS — N184 Chronic kidney disease, stage 4 (severe): Secondary | ICD-10-CM | POA: Diagnosis not present

## 2020-08-31 DIAGNOSIS — I129 Hypertensive chronic kidney disease with stage 1 through stage 4 chronic kidney disease, or unspecified chronic kidney disease: Secondary | ICD-10-CM | POA: Diagnosis not present

## 2020-09-20 DIAGNOSIS — Z20822 Contact with and (suspected) exposure to covid-19: Secondary | ICD-10-CM | POA: Diagnosis not present

## 2020-10-26 DIAGNOSIS — I129 Hypertensive chronic kidney disease with stage 1 through stage 4 chronic kidney disease, or unspecified chronic kidney disease: Secondary | ICD-10-CM | POA: Diagnosis not present

## 2020-10-26 DIAGNOSIS — N2581 Secondary hyperparathyroidism of renal origin: Secondary | ICD-10-CM | POA: Diagnosis not present

## 2020-10-26 DIAGNOSIS — N184 Chronic kidney disease, stage 4 (severe): Secondary | ICD-10-CM | POA: Diagnosis not present

## 2020-10-26 DIAGNOSIS — D631 Anemia in chronic kidney disease: Secondary | ICD-10-CM | POA: Diagnosis not present

## 2020-11-10 DIAGNOSIS — R609 Edema, unspecified: Secondary | ICD-10-CM | POA: Diagnosis not present

## 2020-11-10 DIAGNOSIS — I129 Hypertensive chronic kidney disease with stage 1 through stage 4 chronic kidney disease, or unspecified chronic kidney disease: Secondary | ICD-10-CM | POA: Diagnosis not present

## 2020-11-10 DIAGNOSIS — Z0000 Encounter for general adult medical examination without abnormal findings: Secondary | ICD-10-CM | POA: Diagnosis not present

## 2020-11-10 DIAGNOSIS — E7800 Pure hypercholesterolemia, unspecified: Secondary | ICD-10-CM | POA: Diagnosis not present

## 2020-11-10 DIAGNOSIS — H409 Unspecified glaucoma: Secondary | ICD-10-CM | POA: Diagnosis not present

## 2020-11-10 DIAGNOSIS — Z1389 Encounter for screening for other disorder: Secondary | ICD-10-CM | POA: Diagnosis not present

## 2020-11-10 DIAGNOSIS — Z6835 Body mass index (BMI) 35.0-35.9, adult: Secondary | ICD-10-CM | POA: Diagnosis not present

## 2020-11-10 DIAGNOSIS — N1831 Chronic kidney disease, stage 3a: Secondary | ICD-10-CM | POA: Diagnosis not present

## 2020-11-10 DIAGNOSIS — K219 Gastro-esophageal reflux disease without esophagitis: Secondary | ICD-10-CM | POA: Diagnosis not present

## 2020-11-10 DIAGNOSIS — M7989 Other specified soft tissue disorders: Secondary | ICD-10-CM | POA: Diagnosis not present

## 2020-11-10 DIAGNOSIS — L509 Urticaria, unspecified: Secondary | ICD-10-CM | POA: Diagnosis not present

## 2020-12-31 DIAGNOSIS — H401133 Primary open-angle glaucoma, bilateral, severe stage: Secondary | ICD-10-CM | POA: Diagnosis not present

## 2021-02-08 DIAGNOSIS — N2581 Secondary hyperparathyroidism of renal origin: Secondary | ICD-10-CM | POA: Diagnosis not present

## 2021-02-08 DIAGNOSIS — I129 Hypertensive chronic kidney disease with stage 1 through stage 4 chronic kidney disease, or unspecified chronic kidney disease: Secondary | ICD-10-CM | POA: Diagnosis not present

## 2021-02-08 DIAGNOSIS — N189 Chronic kidney disease, unspecified: Secondary | ICD-10-CM | POA: Diagnosis not present

## 2021-02-08 DIAGNOSIS — D631 Anemia in chronic kidney disease: Secondary | ICD-10-CM | POA: Diagnosis not present

## 2021-02-08 DIAGNOSIS — N184 Chronic kidney disease, stage 4 (severe): Secondary | ICD-10-CM | POA: Diagnosis not present

## 2021-05-11 DIAGNOSIS — N189 Chronic kidney disease, unspecified: Secondary | ICD-10-CM | POA: Diagnosis not present

## 2021-05-11 DIAGNOSIS — D631 Anemia in chronic kidney disease: Secondary | ICD-10-CM | POA: Diagnosis not present

## 2021-05-11 DIAGNOSIS — N184 Chronic kidney disease, stage 4 (severe): Secondary | ICD-10-CM | POA: Diagnosis not present

## 2021-05-11 DIAGNOSIS — N2581 Secondary hyperparathyroidism of renal origin: Secondary | ICD-10-CM | POA: Diagnosis not present

## 2021-05-11 DIAGNOSIS — I129 Hypertensive chronic kidney disease with stage 1 through stage 4 chronic kidney disease, or unspecified chronic kidney disease: Secondary | ICD-10-CM | POA: Diagnosis not present

## 2021-06-07 DIAGNOSIS — H401133 Primary open-angle glaucoma, bilateral, severe stage: Secondary | ICD-10-CM | POA: Diagnosis not present

## 2021-08-18 DIAGNOSIS — N189 Chronic kidney disease, unspecified: Secondary | ICD-10-CM | POA: Diagnosis not present

## 2021-08-18 DIAGNOSIS — N2581 Secondary hyperparathyroidism of renal origin: Secondary | ICD-10-CM | POA: Diagnosis not present

## 2021-08-18 DIAGNOSIS — I129 Hypertensive chronic kidney disease with stage 1 through stage 4 chronic kidney disease, or unspecified chronic kidney disease: Secondary | ICD-10-CM | POA: Diagnosis not present

## 2021-08-18 DIAGNOSIS — D631 Anemia in chronic kidney disease: Secondary | ICD-10-CM | POA: Diagnosis not present

## 2021-08-18 DIAGNOSIS — N184 Chronic kidney disease, stage 4 (severe): Secondary | ICD-10-CM | POA: Diagnosis not present

## 2021-11-15 DIAGNOSIS — E7800 Pure hypercholesterolemia, unspecified: Secondary | ICD-10-CM | POA: Diagnosis not present

## 2021-11-15 DIAGNOSIS — H409 Unspecified glaucoma: Secondary | ICD-10-CM | POA: Diagnosis not present

## 2021-11-15 DIAGNOSIS — M7989 Other specified soft tissue disorders: Secondary | ICD-10-CM | POA: Diagnosis not present

## 2021-11-15 DIAGNOSIS — I129 Hypertensive chronic kidney disease with stage 1 through stage 4 chronic kidney disease, or unspecified chronic kidney disease: Secondary | ICD-10-CM | POA: Diagnosis not present

## 2021-11-15 DIAGNOSIS — R001 Bradycardia, unspecified: Secondary | ICD-10-CM | POA: Diagnosis not present

## 2021-11-15 DIAGNOSIS — H401133 Primary open-angle glaucoma, bilateral, severe stage: Secondary | ICD-10-CM | POA: Diagnosis not present

## 2021-11-15 DIAGNOSIS — L509 Urticaria, unspecified: Secondary | ICD-10-CM | POA: Diagnosis not present

## 2021-11-15 DIAGNOSIS — N1831 Chronic kidney disease, stage 3a: Secondary | ICD-10-CM | POA: Diagnosis not present

## 2021-11-15 DIAGNOSIS — K219 Gastro-esophageal reflux disease without esophagitis: Secondary | ICD-10-CM | POA: Diagnosis not present

## 2021-11-15 DIAGNOSIS — Z0000 Encounter for general adult medical examination without abnormal findings: Secondary | ICD-10-CM | POA: Diagnosis not present

## 2021-11-15 DIAGNOSIS — R609 Edema, unspecified: Secondary | ICD-10-CM | POA: Diagnosis not present

## 2021-11-15 DIAGNOSIS — Z23 Encounter for immunization: Secondary | ICD-10-CM | POA: Diagnosis not present

## 2021-11-16 DIAGNOSIS — N184 Chronic kidney disease, stage 4 (severe): Secondary | ICD-10-CM | POA: Diagnosis not present

## 2021-11-16 DIAGNOSIS — N2581 Secondary hyperparathyroidism of renal origin: Secondary | ICD-10-CM | POA: Diagnosis not present

## 2021-11-16 DIAGNOSIS — I129 Hypertensive chronic kidney disease with stage 1 through stage 4 chronic kidney disease, or unspecified chronic kidney disease: Secondary | ICD-10-CM | POA: Diagnosis not present

## 2021-11-16 DIAGNOSIS — N189 Chronic kidney disease, unspecified: Secondary | ICD-10-CM | POA: Diagnosis not present

## 2021-11-16 DIAGNOSIS — D631 Anemia in chronic kidney disease: Secondary | ICD-10-CM | POA: Diagnosis not present

## 2021-12-29 DIAGNOSIS — D649 Anemia, unspecified: Secondary | ICD-10-CM | POA: Diagnosis not present

## 2022-02-16 DIAGNOSIS — N189 Chronic kidney disease, unspecified: Secondary | ICD-10-CM | POA: Diagnosis not present

## 2022-02-16 DIAGNOSIS — N2581 Secondary hyperparathyroidism of renal origin: Secondary | ICD-10-CM | POA: Diagnosis not present

## 2022-02-16 DIAGNOSIS — R42 Dizziness and giddiness: Secondary | ICD-10-CM | POA: Diagnosis not present

## 2022-02-16 DIAGNOSIS — I129 Hypertensive chronic kidney disease with stage 1 through stage 4 chronic kidney disease, or unspecified chronic kidney disease: Secondary | ICD-10-CM | POA: Diagnosis not present

## 2022-02-16 DIAGNOSIS — D631 Anemia in chronic kidney disease: Secondary | ICD-10-CM | POA: Diagnosis not present

## 2022-02-16 DIAGNOSIS — N184 Chronic kidney disease, stage 4 (severe): Secondary | ICD-10-CM | POA: Diagnosis not present

## 2022-03-03 DIAGNOSIS — D649 Anemia, unspecified: Secondary | ICD-10-CM | POA: Diagnosis not present

## 2022-05-04 DIAGNOSIS — H401133 Primary open-angle glaucoma, bilateral, severe stage: Secondary | ICD-10-CM | POA: Diagnosis not present

## 2022-05-31 DIAGNOSIS — K219 Gastro-esophageal reflux disease without esophagitis: Secondary | ICD-10-CM | POA: Diagnosis not present

## 2022-05-31 DIAGNOSIS — N1831 Chronic kidney disease, stage 3a: Secondary | ICD-10-CM | POA: Diagnosis not present

## 2022-05-31 DIAGNOSIS — R609 Edema, unspecified: Secondary | ICD-10-CM | POA: Diagnosis not present

## 2022-05-31 DIAGNOSIS — L509 Urticaria, unspecified: Secondary | ICD-10-CM | POA: Diagnosis not present

## 2022-05-31 DIAGNOSIS — R001 Bradycardia, unspecified: Secondary | ICD-10-CM | POA: Diagnosis not present

## 2022-05-31 DIAGNOSIS — H409 Unspecified glaucoma: Secondary | ICD-10-CM | POA: Diagnosis not present

## 2022-05-31 DIAGNOSIS — D649 Anemia, unspecified: Secondary | ICD-10-CM | POA: Diagnosis not present

## 2022-05-31 DIAGNOSIS — E7800 Pure hypercholesterolemia, unspecified: Secondary | ICD-10-CM | POA: Diagnosis not present

## 2022-05-31 DIAGNOSIS — I129 Hypertensive chronic kidney disease with stage 1 through stage 4 chronic kidney disease, or unspecified chronic kidney disease: Secondary | ICD-10-CM | POA: Diagnosis not present

## 2022-05-31 DIAGNOSIS — Z23 Encounter for immunization: Secondary | ICD-10-CM | POA: Diagnosis not present

## 2022-05-31 DIAGNOSIS — M7989 Other specified soft tissue disorders: Secondary | ICD-10-CM | POA: Diagnosis not present

## 2022-06-28 DIAGNOSIS — N2581 Secondary hyperparathyroidism of renal origin: Secondary | ICD-10-CM | POA: Diagnosis not present

## 2022-06-28 DIAGNOSIS — N184 Chronic kidney disease, stage 4 (severe): Secondary | ICD-10-CM | POA: Diagnosis not present

## 2022-06-28 DIAGNOSIS — N189 Chronic kidney disease, unspecified: Secondary | ICD-10-CM | POA: Diagnosis not present

## 2022-07-05 DIAGNOSIS — N2581 Secondary hyperparathyroidism of renal origin: Secondary | ICD-10-CM | POA: Diagnosis not present

## 2022-07-05 DIAGNOSIS — D631 Anemia in chronic kidney disease: Secondary | ICD-10-CM | POA: Diagnosis not present

## 2022-07-05 DIAGNOSIS — I129 Hypertensive chronic kidney disease with stage 1 through stage 4 chronic kidney disease, or unspecified chronic kidney disease: Secondary | ICD-10-CM | POA: Diagnosis not present

## 2022-07-05 DIAGNOSIS — N184 Chronic kidney disease, stage 4 (severe): Secondary | ICD-10-CM | POA: Diagnosis not present

## 2022-10-17 DIAGNOSIS — N184 Chronic kidney disease, stage 4 (severe): Secondary | ICD-10-CM | POA: Diagnosis not present

## 2022-10-26 DIAGNOSIS — I129 Hypertensive chronic kidney disease with stage 1 through stage 4 chronic kidney disease, or unspecified chronic kidney disease: Secondary | ICD-10-CM | POA: Diagnosis not present

## 2022-10-26 DIAGNOSIS — N184 Chronic kidney disease, stage 4 (severe): Secondary | ICD-10-CM | POA: Diagnosis not present

## 2022-10-26 DIAGNOSIS — N2581 Secondary hyperparathyroidism of renal origin: Secondary | ICD-10-CM | POA: Diagnosis not present

## 2022-10-26 DIAGNOSIS — D631 Anemia in chronic kidney disease: Secondary | ICD-10-CM | POA: Diagnosis not present

## 2022-11-30 DIAGNOSIS — R609 Edema, unspecified: Secondary | ICD-10-CM | POA: Diagnosis not present

## 2022-11-30 DIAGNOSIS — N2581 Secondary hyperparathyroidism of renal origin: Secondary | ICD-10-CM | POA: Diagnosis not present

## 2022-11-30 DIAGNOSIS — Z23 Encounter for immunization: Secondary | ICD-10-CM | POA: Diagnosis not present

## 2022-11-30 DIAGNOSIS — Z1331 Encounter for screening for depression: Secondary | ICD-10-CM | POA: Diagnosis not present

## 2022-11-30 DIAGNOSIS — D649 Anemia, unspecified: Secondary | ICD-10-CM | POA: Diagnosis not present

## 2022-11-30 DIAGNOSIS — H409 Unspecified glaucoma: Secondary | ICD-10-CM | POA: Diagnosis not present

## 2022-11-30 DIAGNOSIS — M7989 Other specified soft tissue disorders: Secondary | ICD-10-CM | POA: Diagnosis not present

## 2022-11-30 DIAGNOSIS — I129 Hypertensive chronic kidney disease with stage 1 through stage 4 chronic kidney disease, or unspecified chronic kidney disease: Secondary | ICD-10-CM | POA: Diagnosis not present

## 2022-11-30 DIAGNOSIS — N184 Chronic kidney disease, stage 4 (severe): Secondary | ICD-10-CM | POA: Diagnosis not present

## 2022-11-30 DIAGNOSIS — K219 Gastro-esophageal reflux disease without esophagitis: Secondary | ICD-10-CM | POA: Diagnosis not present

## 2022-11-30 DIAGNOSIS — E7800 Pure hypercholesterolemia, unspecified: Secondary | ICD-10-CM | POA: Diagnosis not present

## 2022-11-30 DIAGNOSIS — Z0000 Encounter for general adult medical examination without abnormal findings: Secondary | ICD-10-CM | POA: Diagnosis not present

## 2023-03-03 DIAGNOSIS — N2581 Secondary hyperparathyroidism of renal origin: Secondary | ICD-10-CM | POA: Diagnosis not present

## 2023-03-03 DIAGNOSIS — I5032 Chronic diastolic (congestive) heart failure: Secondary | ICD-10-CM | POA: Diagnosis not present

## 2023-03-03 DIAGNOSIS — E7800 Pure hypercholesterolemia, unspecified: Secondary | ICD-10-CM | POA: Diagnosis not present

## 2023-03-03 DIAGNOSIS — I129 Hypertensive chronic kidney disease with stage 1 through stage 4 chronic kidney disease, or unspecified chronic kidney disease: Secondary | ICD-10-CM | POA: Diagnosis not present

## 2023-03-03 DIAGNOSIS — N184 Chronic kidney disease, stage 4 (severe): Secondary | ICD-10-CM | POA: Diagnosis not present

## 2023-03-03 DIAGNOSIS — K219 Gastro-esophageal reflux disease without esophagitis: Secondary | ICD-10-CM | POA: Diagnosis not present

## 2023-04-03 DIAGNOSIS — N184 Chronic kidney disease, stage 4 (severe): Secondary | ICD-10-CM | POA: Diagnosis not present

## 2023-05-05 DIAGNOSIS — I5032 Chronic diastolic (congestive) heart failure: Secondary | ICD-10-CM | POA: Diagnosis not present

## 2023-05-05 DIAGNOSIS — I129 Hypertensive chronic kidney disease with stage 1 through stage 4 chronic kidney disease, or unspecified chronic kidney disease: Secondary | ICD-10-CM | POA: Diagnosis not present

## 2023-05-05 DIAGNOSIS — N2581 Secondary hyperparathyroidism of renal origin: Secondary | ICD-10-CM | POA: Diagnosis not present

## 2023-05-05 DIAGNOSIS — D631 Anemia in chronic kidney disease: Secondary | ICD-10-CM | POA: Diagnosis not present

## 2023-05-05 DIAGNOSIS — N184 Chronic kidney disease, stage 4 (severe): Secondary | ICD-10-CM | POA: Diagnosis not present

## 2023-06-22 DIAGNOSIS — I129 Hypertensive chronic kidney disease with stage 1 through stage 4 chronic kidney disease, or unspecified chronic kidney disease: Secondary | ICD-10-CM | POA: Diagnosis not present

## 2023-06-22 DIAGNOSIS — N2581 Secondary hyperparathyroidism of renal origin: Secondary | ICD-10-CM | POA: Diagnosis not present

## 2023-06-22 DIAGNOSIS — H409 Unspecified glaucoma: Secondary | ICD-10-CM | POA: Diagnosis not present

## 2023-06-22 DIAGNOSIS — N529 Male erectile dysfunction, unspecified: Secondary | ICD-10-CM | POA: Diagnosis not present

## 2023-06-22 DIAGNOSIS — Z23 Encounter for immunization: Secondary | ICD-10-CM | POA: Diagnosis not present

## 2023-06-22 DIAGNOSIS — R001 Bradycardia, unspecified: Secondary | ICD-10-CM | POA: Diagnosis not present

## 2023-06-22 DIAGNOSIS — E7800 Pure hypercholesterolemia, unspecified: Secondary | ICD-10-CM | POA: Diagnosis not present

## 2023-06-22 DIAGNOSIS — N184 Chronic kidney disease, stage 4 (severe): Secondary | ICD-10-CM | POA: Diagnosis not present

## 2023-06-22 DIAGNOSIS — R609 Edema, unspecified: Secondary | ICD-10-CM | POA: Diagnosis not present

## 2023-06-22 DIAGNOSIS — K219 Gastro-esophageal reflux disease without esophagitis: Secondary | ICD-10-CM | POA: Diagnosis not present

## 2023-06-22 DIAGNOSIS — D649 Anemia, unspecified: Secondary | ICD-10-CM | POA: Diagnosis not present

## 2023-10-30 DIAGNOSIS — N184 Chronic kidney disease, stage 4 (severe): Secondary | ICD-10-CM | POA: Diagnosis not present

## 2023-11-08 DIAGNOSIS — N184 Chronic kidney disease, stage 4 (severe): Secondary | ICD-10-CM | POA: Diagnosis not present

## 2023-12-06 DIAGNOSIS — I129 Hypertensive chronic kidney disease with stage 1 through stage 4 chronic kidney disease, or unspecified chronic kidney disease: Secondary | ICD-10-CM | POA: Diagnosis not present

## 2023-12-06 DIAGNOSIS — K219 Gastro-esophageal reflux disease without esophagitis: Secondary | ICD-10-CM | POA: Diagnosis not present

## 2023-12-06 DIAGNOSIS — E7800 Pure hypercholesterolemia, unspecified: Secondary | ICD-10-CM | POA: Diagnosis not present

## 2023-12-06 DIAGNOSIS — R609 Edema, unspecified: Secondary | ICD-10-CM | POA: Diagnosis not present

## 2023-12-06 DIAGNOSIS — D649 Anemia, unspecified: Secondary | ICD-10-CM | POA: Diagnosis not present

## 2023-12-06 DIAGNOSIS — Z1331 Encounter for screening for depression: Secondary | ICD-10-CM | POA: Diagnosis not present

## 2023-12-06 DIAGNOSIS — H409 Unspecified glaucoma: Secondary | ICD-10-CM | POA: Diagnosis not present

## 2023-12-06 DIAGNOSIS — Z0000 Encounter for general adult medical examination without abnormal findings: Secondary | ICD-10-CM | POA: Diagnosis not present

## 2023-12-06 DIAGNOSIS — N184 Chronic kidney disease, stage 4 (severe): Secondary | ICD-10-CM | POA: Diagnosis not present

## 2023-12-06 DIAGNOSIS — Z9181 History of falling: Secondary | ICD-10-CM | POA: Diagnosis not present

## 2023-12-06 DIAGNOSIS — R443 Hallucinations, unspecified: Secondary | ICD-10-CM | POA: Diagnosis not present

## 2023-12-06 DIAGNOSIS — R001 Bradycardia, unspecified: Secondary | ICD-10-CM | POA: Diagnosis not present

## 2023-12-14 DIAGNOSIS — M150 Primary generalized (osteo)arthritis: Secondary | ICD-10-CM | POA: Diagnosis not present

## 2023-12-14 DIAGNOSIS — R443 Hallucinations, unspecified: Secondary | ICD-10-CM | POA: Diagnosis not present

## 2023-12-14 DIAGNOSIS — R001 Bradycardia, unspecified: Secondary | ICD-10-CM | POA: Diagnosis not present

## 2023-12-14 DIAGNOSIS — N529 Male erectile dysfunction, unspecified: Secondary | ICD-10-CM | POA: Diagnosis not present

## 2023-12-14 DIAGNOSIS — I129 Hypertensive chronic kidney disease with stage 1 through stage 4 chronic kidney disease, or unspecified chronic kidney disease: Secondary | ICD-10-CM | POA: Diagnosis not present

## 2023-12-14 DIAGNOSIS — E7800 Pure hypercholesterolemia, unspecified: Secondary | ICD-10-CM | POA: Diagnosis not present

## 2023-12-14 DIAGNOSIS — I429 Cardiomyopathy, unspecified: Secondary | ICD-10-CM | POA: Diagnosis not present

## 2023-12-14 DIAGNOSIS — N2581 Secondary hyperparathyroidism of renal origin: Secondary | ICD-10-CM | POA: Diagnosis not present

## 2023-12-14 DIAGNOSIS — Z556 Problems related to health literacy: Secondary | ICD-10-CM | POA: Diagnosis not present

## 2023-12-14 DIAGNOSIS — K219 Gastro-esophageal reflux disease without esophagitis: Secondary | ICD-10-CM | POA: Diagnosis not present

## 2023-12-14 DIAGNOSIS — H409 Unspecified glaucoma: Secondary | ICD-10-CM | POA: Diagnosis not present

## 2023-12-14 DIAGNOSIS — N184 Chronic kidney disease, stage 4 (severe): Secondary | ICD-10-CM | POA: Diagnosis not present

## 2023-12-14 DIAGNOSIS — D631 Anemia in chronic kidney disease: Secondary | ICD-10-CM | POA: Diagnosis not present

## 2024-04-29 DIAGNOSIS — N184 Chronic kidney disease, stage 4 (severe): Secondary | ICD-10-CM | POA: Diagnosis not present

## 2024-05-09 DIAGNOSIS — I129 Hypertensive chronic kidney disease with stage 1 through stage 4 chronic kidney disease, or unspecified chronic kidney disease: Secondary | ICD-10-CM | POA: Diagnosis not present

## 2024-05-09 DIAGNOSIS — D631 Anemia in chronic kidney disease: Secondary | ICD-10-CM | POA: Diagnosis not present

## 2024-05-09 DIAGNOSIS — N184 Chronic kidney disease, stage 4 (severe): Secondary | ICD-10-CM | POA: Diagnosis not present

## 2024-05-09 DIAGNOSIS — N2581 Secondary hyperparathyroidism of renal origin: Secondary | ICD-10-CM | POA: Diagnosis not present

## 2024-05-09 DIAGNOSIS — I5032 Chronic diastolic (congestive) heart failure: Secondary | ICD-10-CM | POA: Diagnosis not present

## 2024-06-19 DIAGNOSIS — R55 Syncope and collapse: Secondary | ICD-10-CM | POA: Diagnosis not present

## 2024-06-19 DIAGNOSIS — D649 Anemia, unspecified: Secondary | ICD-10-CM | POA: Diagnosis not present

## 2024-06-25 DIAGNOSIS — E559 Vitamin D deficiency, unspecified: Secondary | ICD-10-CM | POA: Diagnosis not present

## 2024-06-25 DIAGNOSIS — N184 Chronic kidney disease, stage 4 (severe): Secondary | ICD-10-CM | POA: Diagnosis not present

## 2024-06-25 DIAGNOSIS — D649 Anemia, unspecified: Secondary | ICD-10-CM | POA: Diagnosis not present

## 2024-06-25 DIAGNOSIS — N529 Male erectile dysfunction, unspecified: Secondary | ICD-10-CM | POA: Diagnosis not present

## 2024-06-25 DIAGNOSIS — R609 Edema, unspecified: Secondary | ICD-10-CM | POA: Diagnosis not present

## 2024-06-25 DIAGNOSIS — H409 Unspecified glaucoma: Secondary | ICD-10-CM | POA: Diagnosis not present

## 2024-06-25 DIAGNOSIS — I129 Hypertensive chronic kidney disease with stage 1 through stage 4 chronic kidney disease, or unspecified chronic kidney disease: Secondary | ICD-10-CM | POA: Diagnosis not present

## 2024-06-25 DIAGNOSIS — E7800 Pure hypercholesterolemia, unspecified: Secondary | ICD-10-CM | POA: Diagnosis not present

## 2024-06-25 DIAGNOSIS — M79672 Pain in left foot: Secondary | ICD-10-CM | POA: Diagnosis not present

## 2024-06-25 DIAGNOSIS — Z23 Encounter for immunization: Secondary | ICD-10-CM | POA: Diagnosis not present

## 2024-06-25 DIAGNOSIS — D509 Iron deficiency anemia, unspecified: Secondary | ICD-10-CM | POA: Diagnosis not present

## 2024-06-25 DIAGNOSIS — R2689 Other abnormalities of gait and mobility: Secondary | ICD-10-CM | POA: Diagnosis not present

## 2024-07-04 ENCOUNTER — Encounter: Payer: Self-pay | Admitting: Podiatry

## 2024-07-04 ENCOUNTER — Ambulatory Visit (INDEPENDENT_AMBULATORY_CARE_PROVIDER_SITE_OTHER)

## 2024-07-04 ENCOUNTER — Ambulatory Visit: Admitting: Podiatry

## 2024-07-04 VITALS — Ht 68.0 in | Wt 225.0 lb

## 2024-07-04 DIAGNOSIS — L03032 Cellulitis of left toe: Secondary | ICD-10-CM | POA: Diagnosis not present

## 2024-07-04 DIAGNOSIS — L02612 Cutaneous abscess of left foot: Secondary | ICD-10-CM

## 2024-07-04 DIAGNOSIS — M7752 Other enthesopathy of left foot: Secondary | ICD-10-CM | POA: Diagnosis not present

## 2024-07-04 MED ORDER — DOXYCYCLINE HYCLATE 100 MG PO TABS: 100.0000 mg | ORAL_TABLET | Freq: Two times a day (BID) | ORAL | 1 refills | Status: AC

## 2024-07-04 NOTE — Progress Notes (Signed)
 Subjective:   Patient ID: Shane Vargas, male   DOB: 83 y.o.   MRN: [id number]   HPI Patient presents with translator stating that he has swelling and pain of his fourth toe left and around the nailbed and did have a pedicure and it seemed to occur after that.  Patient is in relatively poor health does not smoke is not active   Review of Systems  All other systems reviewed and are negative.       Objective:  Physical Exam Vitals and nursing note reviewed.  Constitutional:      Appearance: He is well-developed.  Pulmonary:     Effort: Pulmonary effort is normal.  Musculoskeletal:        General: Normal range of motion.  Skin:    General: Skin is warm.  Neurological:     Mental Status: He is alert.     Vascular status intact moderate diminishment of neurological sharp dull vibratory.  Patient has good digital perfusion but was noted to have swelling of the inner phalangeal joint digit 4 left with a thickened toenail but no active drainage currently or no proximal edema erythema or drainage noted.  Patient is well-oriented and has interpreter     Assessment:  Cannot rule out infection or inflammation with possible trauma fourth digit left more related to nail even though there is no drainage from the nail     Plan:  H&P and through interpreter explained to him condition and treatment options and at this point I am placing him on doxycycline  soaks and open toed shoes.  Patient will be seen back in the next several weeks with strict instructions of any increased redness or any indications of systemic infection to go to the hospital.  X-rays indicate that there is possible osteolysis of the distal phalanx middle phalanx with difficult to make a determination as to that pathology currently with no clinical correlation
